# Patient Record
Sex: Male | Born: 1975 | Race: Black or African American | Hispanic: No | Marital: Single | Smoking: Current some day smoker
Health system: Southern US, Community
[De-identification: ages and names within clinical notes are randomized; demographics above are authoritative.]

---

## 2002-04-01 ENCOUNTER — Emergency Department (HOSPITAL_COMMUNITY): Admission: EM | Admit: 2002-04-01 | Discharge: 2002-04-01 | Payer: Self-pay

## 2004-07-26 ENCOUNTER — Emergency Department (HOSPITAL_COMMUNITY): Admission: EM | Admit: 2004-07-26 | Discharge: 2004-07-26 | Payer: Self-pay | Admitting: Emergency Medicine

## 2007-09-27 ENCOUNTER — Emergency Department (HOSPITAL_COMMUNITY): Admission: EM | Admit: 2007-09-27 | Discharge: 2007-09-27 | Payer: Self-pay | Admitting: Emergency Medicine

## 2008-03-20 ENCOUNTER — Emergency Department (HOSPITAL_COMMUNITY): Admission: EM | Admit: 2008-03-20 | Discharge: 2008-03-20 | Payer: Self-pay | Admitting: Emergency Medicine

## 2009-11-21 ENCOUNTER — Emergency Department (HOSPITAL_COMMUNITY): Admission: EM | Admit: 2009-11-21 | Discharge: 2009-11-21 | Payer: Self-pay | Admitting: Family Medicine

## 2009-11-25 ENCOUNTER — Observation Stay (HOSPITAL_COMMUNITY): Admission: EM | Admit: 2009-11-25 | Discharge: 2009-11-26 | Payer: Self-pay | Admitting: Emergency Medicine

## 2009-11-25 DIAGNOSIS — IMO0002 Reserved for concepts with insufficient information to code with codable children: Secondary | ICD-10-CM

## 2009-12-04 ENCOUNTER — Encounter: Admission: RE | Admit: 2009-12-04 | Discharge: 2010-02-28 | Payer: Self-pay | Admitting: Orthopedic Surgery

## 2009-12-17 ENCOUNTER — Ambulatory Visit (HOSPITAL_BASED_OUTPATIENT_CLINIC_OR_DEPARTMENT_OTHER): Admission: RE | Admit: 2009-12-17 | Discharge: 2009-12-17 | Payer: Self-pay | Admitting: Orthopedic Surgery

## 2010-05-21 ENCOUNTER — Encounter (INDEPENDENT_AMBULATORY_CARE_PROVIDER_SITE_OTHER): Payer: Self-pay | Admitting: *Deleted

## 2010-05-28 ENCOUNTER — Ambulatory Visit (HOSPITAL_COMMUNITY): Admission: RE | Admit: 2010-05-28 | Discharge: 2010-05-28 | Payer: Self-pay | Admitting: Infectious Disease

## 2010-05-28 ENCOUNTER — Ambulatory Visit: Payer: Self-pay | Admitting: Infectious Disease

## 2010-05-28 DIAGNOSIS — I10 Essential (primary) hypertension: Secondary | ICD-10-CM | POA: Insufficient documentation

## 2010-05-28 DIAGNOSIS — M86649 Other chronic osteomyelitis, unspecified hand: Secondary | ICD-10-CM

## 2010-05-28 LAB — CONVERTED CEMR LAB
Basophils Absolute: 0 10*3/uL (ref 0.0–0.1)
CO2: 27 meq/L (ref 19–32)
CRP: 0.1 mg/dL (ref <0.6)
Chloride: 105 meq/L (ref 96–112)
Creatinine, Ser: 1.09 mg/dL (ref 0.40–1.50)
Eosinophils Relative: 2 % (ref 0–5)
HCT: 46.2 % (ref 39.0–52.0)
Lymphocytes Relative: 52 % — ABNORMAL HIGH (ref 12–46)
Lymphs Abs: 2.7 10*3/uL (ref 0.7–4.0)
Neutro Abs: 1.9 10*3/uL (ref 1.7–7.7)
Neutrophils Relative %: 38 % — ABNORMAL LOW (ref 43–77)
Platelets: 274 10*3/uL (ref 150–400)
Potassium: 4.6 meq/L (ref 3.5–5.3)
RDW: 12.5 % (ref 11.5–15.5)
Sed Rate: 2 mm/hr (ref 0–16)
Sodium: 141 meq/L (ref 135–145)
WBC: 5.1 10*3/uL (ref 4.0–10.5)

## 2010-06-17 ENCOUNTER — Ambulatory Visit (HOSPITAL_BASED_OUTPATIENT_CLINIC_OR_DEPARTMENT_OTHER): Admission: RE | Admit: 2010-06-17 | Discharge: 2010-06-17 | Payer: Self-pay | Admitting: Orthopedic Surgery

## 2010-06-19 ENCOUNTER — Encounter: Admission: RE | Admit: 2010-06-19 | Discharge: 2010-07-14 | Payer: Self-pay | Admitting: Orthopedic Surgery

## 2010-11-18 NOTE — Miscellaneous (Signed)
Summary: Problems, Medications and Allergies  Clinical Lists Changes  Problems: Added new problem of OPEN FRACTURE MID/PROXIMAL PHALANX/PHALANG HAND (ICD-816.11) - right small finger, first surgery Medications: Added new medication of BACTRIM DS 800-160 MG TABS (SULFAMETHOXAZOLE-TRIMETHOPRIM) Take 1 tablet by mouth two times a day per Dr. Merlyn Lot Added new medication of DOXYCYCLINE HYCLATE 100 MG CAPS (DOXYCYCLINE HYCLATE) Take 1 capsule by mouth two times a day per Dr. Merlyn Lot Added new medication of NORCO 5-325 MG TABS (HYDROCODONE-ACETAMINOPHEN) Take 1-2 tablets by mouth every 6 hours as needed for pain by Dr. Abran Richard - Signed Rx of NORCO 5-325 MG TABS (HYDROCODONE-ACETAMINOPHEN) Take 1-2 tablets by mouth every 6 hours as needed for pain by Dr. Abran Richard;  #60 x 0;  Signed;  Entered by: Jennet Maduro RN;  Authorized by: Paulette Blanch Dam MD;  Method used: Print then Give to Patient Observations: Added new observation of NKA: T (05/21/2010 11:47)    Prescriptions: NORCO 5-325 MG TABS (HYDROCODONE-ACETAMINOPHEN) Take 1-2 tablets by mouth every 6 hours as needed for pain by Dr. Abran Richard  #60 x 0   Entered by:   Jennet Maduro RN   Authorized by:   Acey Lav MD   Signed by:   Jennet Maduro RN on 05/22/2010   Method used:   Print then Give to Patient   RxID:   [id number]   Appended Document: Problems, Medications and Allergies The prescription for [facility] was printed in error.  It was destroyed.

## 2010-11-18 NOTE — Miscellaneous (Signed)
Summary: HIPAA Restrictions  HIPAA Restrictions   Imported By: Florinda Marker 05/29/2010 09:36:59  _____________________________________________________________________  External Attachment:    Type:   Image     Comment:   External Document

## 2010-11-18 NOTE — Assessment & Plan Note (Signed)
Summary: new pt infected R. hand   Visit Type:  Consult Referring Delmont Prosch:  Betha Loa Primary Haleem Hanner:  None  CC:  new patient infected right hand - pinky finger.  History of Present Illness: 35 yo fell on  right hand proximal phalanx fracture after fall from deck. He had I and D with open reduction and percutaneous pinning right  small finger prox phalanz and rerepair of wxtensor tendon on 11/24/09 by Dr. Merlyn Lot and ORIF on 12/17/09. On 3/15 he was given bactrim ds bid for 7 days because pin was pushed back in whe. On 4/13 he was seen and xrays showed some bony erosion on ulnar side of prox fx with ysis around transverse portions of tensionband. he was placed oback on bactrim ds and doxycyline bid. Dr. Merlyn Lot had referred him to our practice at that time. He never was seen (apparently due to his beign out of town frequently. He was given an additional doxy and bactrim on 4/28. He has not been seen by Dr. Merlyn Lot since late April. He finally made appt with [place] now on 05/28/10. He denies fevers or chills but did have drainge of pus from his finger 3 weeks ago. His finger continues to hurt in the am especially. He has been off antibiotics for nearly 3 months.  Problems Prior to Update: 1)  Open Fracture Mid/proximal Phalanx/phalang Hand  (ICD-816.11)  Medications Prior to Update: 1)  Bactrim Ds 800-160 Mg Tabs (Sulfamethoxazole-Trimethoprim) .... Take 1 Tablet By Mouth Two Times A Day Per Dr. Merlyn Lot 2)  Doxycycline Hyclate 100 Mg Caps (Doxycycline Hyclate) .... Take 1 Capsule By Mouth Two Times A Day Per Dr. Merlyn Lot 3)  Norco 5-325 Mg Tabs (Hydrocodone-Acetaminophen) .... Take 1-2 Tablets By Mouth Every 6 Hours As Needed For Pain By Dr. Abran Richard  Current Medications (verified): 1)  Bactrim Ds 800-160 Mg Tabs (Sulfamethoxazole-Trimethoprim) .... Take 1 Tablet By Mouth Two Times A Day Per Dr. Merlyn Lot 2)  Doxycycline Hyclate 100 Mg Caps (Doxycycline Hyclate) .... Take 1 Capsule By Mouth Two Times A Day Per Dr.  Merlyn Lot 3)  Norco 5-325 Mg Tabs (Hydrocodone-Acetaminophen) .... Take 1-2 Tablets By Mouth Every 6 Hours As Needed For Pain By Dr. Abran Richard  Allergies (verified): No Known Drug Allergies   Preventive Screening-Counseling & Management  Alcohol-Tobacco     Alcohol drinks/day: occassionally     Alcohol type: beer     Smoking Status: current     Packs/Day: 0.25  Caffeine-Diet-Exercise     Caffeine use/day: 0     Does Patient Exercise: no     Type of exercise: walk dog  Safety-Violence-Falls     Seat Belt Use: yes   Current Allergies (reviewed today): No known allergies  Past History:  Past Medical History: Fracture as above possible osteomyelitis  Past Surgical History: see above  Family History: Family members healthy to his knowledge  Social History: smoike half pack q 3 day beer every day single  Review of Systems  The patient denies anorexia, fever, weight loss, weight gain, vision loss, decreased hearing, hoarseness, chest pain, syncope, dyspnea on exertion, peripheral edema, prolonged cough, headaches, hemoptysis, abdominal pain, melena, hematochezia, severe indigestion/heartburn, hematuria, incontinence, genital sores, muscle weakness, suspicious skin lesions, transient blindness, difficulty walking, depression, unusual weight change, abnormal bleeding, and enlarged lymph nodes.    Vital Signs:  Patient profile:   35 year old male Height:      69 inches (175.26 cm) Weight:      145.8 pounds (66.27 kg)  BMI:     21.61 Temp:     98.2 degrees F oral Pulse rate:   72 / minute BP sitting:   136 / 78  (left arm)  Vitals Entered By: Kathi Simpers [facility]) (May 28, 2010 10:50 AM) CC: new patient infected right hand - pinky finger Pain Assessment Patient in pain? yes     Location: right pinky finger Intensity: 4 Type: tingling Onset of pain  Constant Nutritional Status BMI of 19 -24 = normal Nutritional Status Detail appetite is fine per patient  Does  patient need assistance? Functional Status Self care Ambulation Normal   Physical Exam  General:  alert, well-developed, well-nourished, and well-hydrated.   Head:  normocephalic, atraumatic, no abnormalities observed, and no abnormalities palpated.   Eyes:  vision grossly intact, pupils equal, pupils round, and pupils reactive to light.   Ears:  no external deformities.   Nose:  no external deformity and no nasal discharge.   Mouth:  pharynx pink and moist, no erythema, and no exudates.   Neck:  supple and full ROM.   Lungs:  normal respiratory effort, no crackles, and no wheezes.   Heart:  normal rate, regular rhythm, no murmur, and no gallop.   Abdomen:  soft, normal bowel sounds, and no distention.   Msk:  his pIP joint of right hand 5th finger is swollen but without drainge. He is tender to palation in this area. Extremities:  No clubbing, cyanosis, edema, or deformity noted with normal full range of motion of all joints.   Neurologic:  alert & oriented X3, sensation intact to light touch, and sensation intact to pinprick.   Skin:  no rashes Psych:  Oriented X3, normally interactive, and good eye contact.     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 I AM suspicous of osteo. I am inclined to push for biopsy of bone OFF antibioitcs with cutlures sent for bacteria, AFB and fungi followed by systemic antibiotics (preferably IV, Will discuss with Dr. Merlyn Lot. ) WIll check HIV elisa for health screening purposes His updated medication list for this problem includes:    Bactrim Ds 800-160 Mg Tabs (Sulfamethoxazole-trimethoprim) .Marland Kitchen... Take 1 tablet by mouth two times a day per dr. Merlyn Lot    Doxycycline Hyclate 100 Mg Caps (Doxycycline hyclate) .Marland Kitchen... Take 1 capsule by mouth two times a day per dr. Dixie Dials 5-325 Mg Tabs (Hydrocodone-acetaminophen) .Marland Kitchen... Take 1-2 tablets by mouth every 6 hours as needed  for pain by dr. Abran Richard  Orders: T-Basic Metabolic Panel [phone]) T-C-Reactive Protein [phone]) T-CBC w/Diff [phone]) T-Sed Rate (Automated) [phone]) T-HIV Antibody  (Reflex) ([id number]) Diagnostic X-Ray/Fluoroscopy (Diagnostic X-Ray/Flu) Est. Patient Level IV (22025)  Problem # 2:  OPEN FRACTURE MID/PROXIMAL PHALANX/PHALANG HAND (ICD-816.11)  concern is for osteo associated with hardware.  Orders: Est. Patient Level IV (42706)  Problem # 3:  ESSENTIAL HYPERTENSION (ICD-401.9)  He has bit of white coat htn likely  Orders: Est. Patient Level IV (23762)  Patient Instructions: 1)  rtc to see Dr. Daiva Eves in one month  Appended Document: PT IS LEVEL 4 CONSULT Pam Please NOTE THAT THIS PATIENT WAS A NEW CONSULT LEVEL FOUR, NOT AN ESTABLISHED PT (THAT WAS BILING ERRO)   Clinical Lists Changes  Orders: Added new Service order of Consultation Level IV [phone]) - Signed

## 2011-01-02 LAB — TISSUE CULTURE: Gram Stain: NONE SEEN

## 2011-01-02 LAB — ANAEROBIC CULTURE: Gram Stain: NONE SEEN

## 2011-01-02 LAB — POCT HEMOGLOBIN-HEMACUE: Hemoglobin: 18.3 g/dL — ABNORMAL HIGH (ref 13.0–17.0)

## 2011-01-07 ENCOUNTER — Encounter: Payer: Self-pay | Admitting: *Deleted

## 2011-01-11 LAB — POCT HEMOGLOBIN-HEMACUE: Hemoglobin: 16.4 g/dL (ref 13.0–17.0)

## 2011-03-31 ENCOUNTER — Ambulatory Visit: Payer: Worker's Compensation | Attending: Physical Medicine and Rehabilitation | Admitting: Physical Therapy

## 2011-03-31 ENCOUNTER — Ambulatory Visit: Payer: Worker's Compensation | Attending: Physical Medicine and Rehabilitation | Admitting: Occupational Therapy

## 2011-03-31 ENCOUNTER — Ambulatory Visit: Payer: Worker's Compensation

## 2011-03-31 DIAGNOSIS — Z5189 Encounter for other specified aftercare: Secondary | ICD-10-CM | POA: Insufficient documentation

## 2011-03-31 DIAGNOSIS — M25579 Pain in unspecified ankle and joints of unspecified foot: Secondary | ICD-10-CM | POA: Insufficient documentation

## 2011-03-31 DIAGNOSIS — R41841 Cognitive communication deficit: Secondary | ICD-10-CM | POA: Insufficient documentation

## 2011-03-31 DIAGNOSIS — R4701 Aphasia: Secondary | ICD-10-CM | POA: Insufficient documentation

## 2011-03-31 DIAGNOSIS — M6281 Muscle weakness (generalized): Secondary | ICD-10-CM | POA: Insufficient documentation

## 2011-03-31 DIAGNOSIS — M2550 Pain in unspecified joint: Secondary | ICD-10-CM | POA: Insufficient documentation

## 2011-03-31 DIAGNOSIS — M2560 Stiffness of unspecified joint, not elsewhere classified: Secondary | ICD-10-CM | POA: Insufficient documentation

## 2011-04-06 ENCOUNTER — Ambulatory Visit: Payer: Worker's Compensation | Admitting: *Deleted

## 2011-04-08 ENCOUNTER — Ambulatory Visit: Payer: Worker's Compensation | Admitting: Occupational Therapy

## 2011-04-08 ENCOUNTER — Ambulatory Visit: Payer: Worker's Compensation

## 2011-04-09 ENCOUNTER — Ambulatory Visit: Payer: Worker's Compensation

## 2011-04-09 ENCOUNTER — Ambulatory Visit: Payer: Worker's Compensation | Admitting: Physical Therapy

## 2011-04-13 ENCOUNTER — Ambulatory Visit: Payer: Worker's Compensation | Admitting: Physical Therapy

## 2011-04-13 ENCOUNTER — Ambulatory Visit: Payer: Worker's Compensation

## 2011-04-15 ENCOUNTER — Ambulatory Visit: Payer: Worker's Compensation | Admitting: Occupational Therapy

## 2011-04-16 ENCOUNTER — Ambulatory Visit: Payer: Worker's Compensation | Admitting: Physical Therapy

## 2011-04-16 ENCOUNTER — Ambulatory Visit: Payer: Worker's Compensation | Admitting: Occupational Therapy

## 2011-04-16 ENCOUNTER — Ambulatory Visit: Payer: Worker's Compensation

## 2011-04-20 ENCOUNTER — Ambulatory Visit: Payer: Worker's Compensation | Admitting: Occupational Therapy

## 2011-04-20 ENCOUNTER — Ambulatory Visit: Payer: Worker's Compensation | Attending: Physical Medicine and Rehabilitation | Admitting: Physical Therapy

## 2011-04-20 ENCOUNTER — Ambulatory Visit: Payer: Worker's Compensation

## 2011-04-20 DIAGNOSIS — R4701 Aphasia: Secondary | ICD-10-CM | POA: Insufficient documentation

## 2011-04-20 DIAGNOSIS — Z5189 Encounter for other specified aftercare: Secondary | ICD-10-CM | POA: Insufficient documentation

## 2011-04-20 DIAGNOSIS — M2550 Pain in unspecified joint: Secondary | ICD-10-CM | POA: Insufficient documentation

## 2011-04-20 DIAGNOSIS — M2560 Stiffness of unspecified joint, not elsewhere classified: Secondary | ICD-10-CM | POA: Insufficient documentation

## 2011-04-20 DIAGNOSIS — M6281 Muscle weakness (generalized): Secondary | ICD-10-CM | POA: Insufficient documentation

## 2011-04-20 DIAGNOSIS — R41841 Cognitive communication deficit: Secondary | ICD-10-CM | POA: Insufficient documentation

## 2011-04-23 ENCOUNTER — Ambulatory Visit: Payer: Worker's Compensation | Admitting: Physical Therapy

## 2011-04-23 ENCOUNTER — Ambulatory Visit: Payer: Worker's Compensation

## 2011-04-27 ENCOUNTER — Encounter: Payer: Self-pay | Admitting: *Deleted

## 2011-04-27 ENCOUNTER — Encounter: Payer: Self-pay | Admitting: Occupational Therapy

## 2011-04-27 ENCOUNTER — Ambulatory Visit: Payer: Self-pay | Admitting: Physical Therapy

## 2011-04-30 ENCOUNTER — Ambulatory Visit: Payer: Worker's Compensation

## 2011-04-30 ENCOUNTER — Ambulatory Visit: Payer: Worker's Compensation | Admitting: Occupational Therapy

## 2011-04-30 ENCOUNTER — Ambulatory Visit: Payer: Worker's Compensation | Admitting: Physical Therapy

## 2011-05-05 ENCOUNTER — Ambulatory Visit: Payer: Worker's Compensation | Admitting: Physical Therapy

## 2011-05-05 ENCOUNTER — Ambulatory Visit: Payer: Worker's Compensation

## 2011-05-05 ENCOUNTER — Ambulatory Visit: Payer: Worker's Compensation | Admitting: Occupational Therapy

## 2011-05-06 ENCOUNTER — Ambulatory Visit: Payer: Worker's Compensation | Admitting: Physical Therapy

## 2011-05-06 ENCOUNTER — Ambulatory Visit: Payer: Worker's Compensation | Admitting: Occupational Therapy

## 2011-05-06 ENCOUNTER — Ambulatory Visit: Payer: Worker's Compensation

## 2011-05-12 ENCOUNTER — Ambulatory Visit: Payer: Worker's Compensation | Admitting: Physical Therapy

## 2011-05-12 ENCOUNTER — Ambulatory Visit: Payer: Worker's Compensation | Admitting: Occupational Therapy

## 2011-05-12 ENCOUNTER — Ambulatory Visit: Payer: Worker's Compensation | Admitting: *Deleted

## 2011-05-14 ENCOUNTER — Ambulatory Visit: Payer: Worker's Compensation | Admitting: Occupational Therapy

## 2011-05-14 ENCOUNTER — Ambulatory Visit: Payer: Worker's Compensation | Admitting: *Deleted

## 2011-05-14 ENCOUNTER — Ambulatory Visit: Payer: Worker's Compensation | Admitting: Physical Therapy

## 2011-05-20 ENCOUNTER — Ambulatory Visit
Payer: Worker's Compensation | Attending: Physical Medicine and Rehabilitation | Admitting: Rehabilitative and Restorative Service Providers"

## 2011-05-20 ENCOUNTER — Ambulatory Visit: Payer: Worker's Compensation | Admitting: Occupational Therapy

## 2011-05-20 DIAGNOSIS — R41841 Cognitive communication deficit: Secondary | ICD-10-CM | POA: Insufficient documentation

## 2011-05-20 DIAGNOSIS — M2560 Stiffness of unspecified joint, not elsewhere classified: Secondary | ICD-10-CM | POA: Insufficient documentation

## 2011-05-20 DIAGNOSIS — Z5189 Encounter for other specified aftercare: Secondary | ICD-10-CM | POA: Insufficient documentation

## 2011-05-20 DIAGNOSIS — R4701 Aphasia: Secondary | ICD-10-CM | POA: Insufficient documentation

## 2011-05-20 DIAGNOSIS — M2550 Pain in unspecified joint: Secondary | ICD-10-CM | POA: Insufficient documentation

## 2011-05-20 DIAGNOSIS — M6281 Muscle weakness (generalized): Secondary | ICD-10-CM | POA: Insufficient documentation

## 2011-05-21 ENCOUNTER — Ambulatory Visit: Payer: Worker's Compensation

## 2011-05-21 ENCOUNTER — Ambulatory Visit: Payer: Worker's Compensation | Admitting: Physical Therapy

## 2011-05-27 ENCOUNTER — Encounter: Payer: Self-pay | Admitting: *Deleted

## 2011-05-27 ENCOUNTER — Encounter: Payer: Self-pay | Admitting: Occupational Therapy

## 2011-05-27 ENCOUNTER — Ambulatory Visit: Payer: Worker's Compensation | Admitting: Physical Therapy

## 2011-05-27 ENCOUNTER — Ambulatory Visit: Payer: Self-pay | Admitting: Physical Therapy

## 2011-05-27 ENCOUNTER — Ambulatory Visit: Payer: Worker's Compensation

## 2011-05-27 ENCOUNTER — Ambulatory Visit: Payer: Worker's Compensation | Admitting: Occupational Therapy

## 2011-05-28 ENCOUNTER — Ambulatory Visit: Payer: Worker's Compensation | Admitting: Occupational Therapy

## 2011-05-28 ENCOUNTER — Ambulatory Visit: Payer: Worker's Compensation | Admitting: Physical Therapy

## 2011-06-01 ENCOUNTER — Ambulatory Visit: Payer: Worker's Compensation

## 2011-06-02 ENCOUNTER — Ambulatory Visit: Payer: Worker's Compensation | Admitting: Occupational Therapy

## 2011-06-02 ENCOUNTER — Ambulatory Visit: Payer: Worker's Compensation | Admitting: Physical Therapy

## 2011-06-04 ENCOUNTER — Ambulatory Visit: Payer: Worker's Compensation | Admitting: Occupational Therapy

## 2011-06-04 ENCOUNTER — Ambulatory Visit: Payer: Worker's Compensation | Admitting: Physical Therapy

## 2011-06-09 ENCOUNTER — Ambulatory Visit: Payer: Self-pay | Admitting: Physical Therapy

## 2011-06-09 ENCOUNTER — Ambulatory Visit: Payer: Worker's Compensation | Admitting: Occupational Therapy

## 2011-06-09 ENCOUNTER — Ambulatory Visit: Payer: Worker's Compensation | Admitting: Physical Therapy

## 2011-06-12 ENCOUNTER — Ambulatory Visit: Payer: Worker's Compensation | Admitting: Occupational Therapy

## 2011-06-16 ENCOUNTER — Encounter: Payer: Self-pay | Admitting: Occupational Therapy

## 2011-06-19 ENCOUNTER — Encounter: Payer: Self-pay | Admitting: Occupational Therapy

## 2011-07-16 LAB — DIFFERENTIAL
Basophils Absolute: 0
Basophils Relative: 0
Lymphocytes Relative: 31
Monocytes Relative: 6
Neutro Abs: 3.1
Neutrophils Relative %: 62

## 2011-07-16 LAB — URINALYSIS, ROUTINE W REFLEX MICROSCOPIC
Nitrite: NEGATIVE
Specific Gravity, Urine: 1.014
Urobilinogen, UA: 1
pH: 7

## 2011-07-16 LAB — POCT I-STAT, CHEM 8
Chloride: 105
Creatinine, Ser: 1.1
HCT: 47
Hemoglobin: 16
Potassium: 4
Sodium: 139

## 2011-07-16 LAB — CBC
Hemoglobin: 15.7
MCHC: 35.4
RBC: 4.67

## 2011-12-17 ENCOUNTER — Emergency Department (HOSPITAL_COMMUNITY)
Admission: EM | Admit: 2011-12-17 | Discharge: 2011-12-17 | Disposition: A | Discharge status: Home / Self Care | Payer: Self-pay | Source: Home / Self Care | Attending: Family Medicine | Admitting: Family Medicine

## 2011-12-17 ENCOUNTER — Encounter (HOSPITAL_COMMUNITY): Payer: Self-pay | Admitting: *Deleted

## 2011-12-17 DIAGNOSIS — Z113 Encounter for screening for infections with a predominantly sexual mode of transmission: Secondary | ICD-10-CM

## 2011-12-17 MED ORDER — AZITHROMYCIN 250 MG PO TABS
ORAL_TABLET | ORAL | Status: AC
  Filled 2011-12-17: qty 4

## 2011-12-17 MED ORDER — AZITHROMYCIN 250 MG PO TABS
1000.0000 mg | ORAL_TABLET | Freq: Once | ORAL | Status: AC
  Administered 2011-12-17: 1000 mg via ORAL

## 2011-12-17 NOTE — Discharge Instructions (Signed)
We will call with test results and treat as indicated °

## 2011-12-17 NOTE — ED Provider Notes (Signed)
History     CSN: [id number]  Arrival date & time 12/17/11  1722   First MD Initiated Contact with Patient 12/17/11 1733      Chief Complaint  Patient presents with  . Exposure to STD    (Consider location/radiation/quality/duration/timing/severity/associated sxs/prior treatment) Patient is a 36 y.o. male presenting with STD exposure. The history is provided by the patient.  Exposure to STD This is a new problem. The current episode started 2 days ago (possible exposure to chlamydia, no sx, uses condoms, no std hx, according to pt.).    History reviewed. No pertinent past medical history.  History reviewed. No pertinent past surgical history.  History reviewed. No pertinent family history.  History  Substance Use Topics  . Smoking status: Current Everyday Smoker  . Smokeless tobacco: Not on file  . Alcohol Use: Yes      Review of Systems  Constitutional: Negative.   Genitourinary: Negative.  Negative for discharge and penile pain.    Allergies  Review of patient's allergies indicates not on file.  Home Medications   Current Outpatient Rx  Name Route Sig Dispense Refill  . DOXYCYCLINE HYCLATE 100 MG PO CAPS Oral Take 100 mg by mouth 2 (two) times daily. Per Dr. Merlyn Lot     . HYDROCODONE-ACETAMINOPHEN 5-325 MG PO TABS Oral Take by mouth as directed. Take 1-2 tablets by mouth every 6 hours as needed for pain by Dr. Abran Richard     . SULFAMETHOXAZOLE-TRIMETHOPRIM 800-160 MG PO TABS Oral Take 1 tablet by mouth 2 (two) times daily. Per Dr. Merlyn Lot       BP 163/92  Pulse 90  Temp(Src) 99 F (37.2 C) (Oral)  Resp 16  SpO2 99%  Physical Exam  Nursing note and vitals reviewed. Constitutional: He appears well-developed and well-nourished.  Abdominal: Soft. Bowel sounds are normal. There is no tenderness.  Genitourinary: Testes normal and penis normal. Cremasteric reflex is present. Right testis shows no tenderness. Left testis shows no tenderness. Circumcised. No penile  tenderness. No discharge found.  Lymphadenopathy:       Right: No inguinal adenopathy present.       Left: No inguinal adenopathy present.    ED Course  Procedures (including critical care time)   Labs Reviewed  GC/CHLAMYDIA PROBE AMP, GENITAL   No results found.   1. Screening for STD (sexually transmitted disease)       MDM          Barkley Bruns, MD 12/17/11 [phone]

## 2011-12-17 NOTE — ED Notes (Signed)
Pt    Wants  To be  Checked  For  Std  He  States  His  Partner  Went to  Dr  Callie Fielding days  Ago  And  He  Thinks   She  Has  chlymydia  He  denys  Any  Symptoms

## 2011-12-18 LAB — GC/CHLAMYDIA PROBE AMP, GENITAL
Chlamydia, DNA Probe: NEGATIVE
GC Probe Amp, Genital: NEGATIVE

## 2013-02-13 ENCOUNTER — Emergency Department (HOSPITAL_COMMUNITY)
Admission: EM | Admit: 2013-02-13 | Discharge: 2013-02-14 | Disposition: A | Discharge status: Home / Self Care | Payer: Self-pay | Attending: Emergency Medicine | Admitting: Emergency Medicine

## 2013-02-13 ENCOUNTER — Encounter (HOSPITAL_COMMUNITY): Payer: Self-pay | Admitting: Emergency Medicine

## 2013-02-13 ENCOUNTER — Emergency Department (HOSPITAL_COMMUNITY): Payer: Self-pay

## 2013-02-13 DIAGNOSIS — F17200 Nicotine dependence, unspecified, uncomplicated: Secondary | ICD-10-CM | POA: Insufficient documentation

## 2013-02-13 DIAGNOSIS — S29011A Strain of muscle and tendon of front wall of thorax, initial encounter: Secondary | ICD-10-CM

## 2013-02-13 DIAGNOSIS — Y9389 Activity, other specified: Secondary | ICD-10-CM | POA: Insufficient documentation

## 2013-02-13 DIAGNOSIS — Y929 Unspecified place or not applicable: Secondary | ICD-10-CM | POA: Insufficient documentation

## 2013-02-13 DIAGNOSIS — S23429A Unspecified sprain of sternum, initial encounter: Secondary | ICD-10-CM | POA: Insufficient documentation

## 2013-02-13 DIAGNOSIS — X500XXA Overexertion from strenuous movement or load, initial encounter: Secondary | ICD-10-CM | POA: Insufficient documentation

## 2013-02-13 LAB — BASIC METABOLIC PANEL
BUN: 15 mg/dL (ref 6–23)
GFR calc Af Amer: 77 mL/min — ABNORMAL LOW (ref >90)
GFR calc non Af Amer: 66 mL/min — ABNORMAL LOW (ref >90)
Potassium: 4.1 mEq/L (ref 3.5–5.1)
Sodium: 141 mEq/L (ref 135–145)

## 2013-02-13 LAB — POCT I-STAT TROPONIN I: Troponin i, poc: 0.01 ng/mL (ref 0.00–0.08)

## 2013-02-13 LAB — CBC
MCHC: 37 g/dL — ABNORMAL HIGH (ref 30.0–36.0)
Platelets: 256 10*3/uL (ref 150–400)
RDW: 12.1 % (ref 11.5–15.5)

## 2013-02-13 NOTE — ED Notes (Signed)
NURSE FIRST ROUNDS : NURSE EXPLAINED DELAY AND PROCESS , RESPIRATIONS UNLABORED , DENIES PAIN SITTING AT WAITING AREA WITH NO DISTRESS.

## 2013-02-13 NOTE — ED Notes (Signed)
Pt c/o left sided CP worse with movement and palpation x 3 days

## 2013-02-14 MED ORDER — IBUPROFEN 800 MG PO TABS: 800.0000 mg | ORAL_TABLET | Freq: Three times a day (TID) | ORAL | Status: DC | PRN

## 2013-02-14 MED ORDER — IBUPROFEN 800 MG PO TABS
800.0000 mg | ORAL_TABLET | Freq: Once | ORAL | Status: AC
  Administered 2013-02-14: 800 mg via ORAL
  Filled 2013-02-14: qty 1

## 2013-02-14 MED ORDER — TRAMADOL HCL 50 MG PO TABS: 50.0000 mg | ORAL_TABLET | Freq: Four times a day (QID) | ORAL | Status: DC

## 2013-02-14 MED ORDER — TRAMADOL HCL 50 MG PO TABS
50.0000 mg | ORAL_TABLET | Freq: Four times a day (QID) | ORAL | Status: DC
  Administered 2013-02-14: 50 mg via ORAL
  Filled 2013-02-14: qty 1

## 2013-02-14 NOTE — ED Notes (Signed)
Pt discharged.Vital signs stable and GCS 15 

## 2013-02-14 NOTE — ED Provider Notes (Signed)
History     CSN: [id number]  Arrival date & time 02/13/13  1478   First MD Initiated Contact with Patient 02/13/13 2345      Chief Complaint  Patient presents with  . Chest Pain    (Consider location/radiation/quality/duration/timing/severity/associated sxs/prior treatment) HPI 36 rolled male presents to emergency room with complaint of left chest wall pain.  Patient reports pain started the day after he lifted his lawnmower into his car.  Patient with point specific pain, just below his left nipple.  Patient works at a [employer] place.  He reports since the injury, any sort of lifting makes the pain worse.  He denies any shortness of breath.  No radiation of the pain.  No nausea no diaphoresis.  Patient is otherwise healthy.  He reports he smokes less than a pack a day.  No leg swelling.  No fevers no chills.  No cough  History reviewed. No pertinent past medical history.  History reviewed. No pertinent past surgical history.  History reviewed. No pertinent family history.  History  Substance Use Topics  . Smoking status: Current Every Day Smoker  . Smokeless tobacco: Not on file  . Alcohol Use: Yes      Review of Systems  See History of Present Illness; otherwise all other systems are reviewed and negative Allergies  Review of patient's allergies indicates no known allergies.  Home Medications  No current outpatient prescriptions on file.  BP 128/86  Pulse 67  Temp(Src) 98.2 F (36.8 C) (Oral)  Resp 14  SpO2 100%  Physical Exam  Nursing note and vitals reviewed. Constitutional: He is oriented to person, place, and time. He appears well-developed and well-nourished.  HENT:  Head: Normocephalic and atraumatic.  Right Ear: External ear normal.  Left Ear: External ear normal.  Nose: Nose normal.  Mouth/Throat: Oropharynx is clear and moist.  Eyes: Conjunctivae and EOM are normal. Pupils are equal, round, and reactive to light.  Neck: Normal range of motion. Neck  supple. No JVD present. No tracheal deviation present. No thyromegaly present.  Cardiovascular: Normal rate, regular rhythm, normal heart sounds and intact distal pulses.  Exam reveals no gallop and no friction rub.   No murmur heard. Pulmonary/Chest: Effort normal and breath sounds normal. No stridor. No respiratory distress. He has no wheezes. He has no rales. He exhibits tenderness (patient with specific tenderness to the left chest wall just under the nipple.  There are no masses.  No skin changes.).  Abdominal: Soft. Bowel sounds are normal. He exhibits no distension and no mass. There is no tenderness. There is no rebound and no guarding.  Musculoskeletal: Normal range of motion. He exhibits no edema and no tenderness.  Lymphadenopathy:    He has no cervical adenopathy.  Neurological: He is oriented to person, place, and time. He has normal reflexes. No cranial nerve deficit. He exhibits normal muscle tone. Coordination normal.  Skin: Skin is dry. No rash noted. No erythema. No pallor.  Psychiatric: He has a normal mood and affect. His behavior is normal. Judgment and thought content normal.    ED Course  Procedures (including critical care time)  Labs Reviewed  CBC - Abnormal; Notable for the following:    MCHC 37.0 (*)    All other components within normal limits  BASIC METABOLIC PANEL - Abnormal; Notable for the following:    GFR calc non Af Amer 66 (*)    GFR calc Af Amer 77 (*)    All other components within  normal limits  POCT I-STAT TROPONIN I   Dg Chest 2 View  02/13/2013  *RADIOLOGY REPORT*  Clinical Data: Left-sided chest pain for 2 days.  CHEST - 2 VIEW  Comparison: 03/20/2008 radiograph.  Findings:  Cardiopericardial silhouette within normal limits. Mediastinal contours normal. Trachea midline.  No airspace disease or effusion.  IMPRESSION: No active cardiopulmonary disease.   Original Report Authenticated By: Andreas Newport, M.D.     Date: 02/14/2013  Rate: 79   Rhythm: normal sinus rhythm  QRS Axis: right  Intervals: normal  ST/T Wave abnormalities: normal  Conduction Disutrbances:none  Narrative Interpretation:   Old EKG Reviewed: none available    1. Chest wall muscle strain, initial encounter       MDM  37 year old male with left chest wall pain.  EKG normal.  Chest x-ray, normal.  Labwork unremarkable.  Pain is reducible with palpation.  Will treat symptomatically        Olivia Mackie, MD 02/14/13 [phone]

## 2013-04-04 ENCOUNTER — Encounter (HOSPITAL_COMMUNITY): Payer: Self-pay | Admitting: Emergency Medicine

## 2013-04-04 ENCOUNTER — Emergency Department (INDEPENDENT_AMBULATORY_CARE_PROVIDER_SITE_OTHER)
Admission: EM | Admit: 2013-04-04 | Discharge: 2013-04-04 | Disposition: A | Discharge status: Home / Self Care | Payer: Self-pay | Source: Home / Self Care | Attending: Family Medicine | Admitting: Family Medicine

## 2013-04-04 DIAGNOSIS — M65839 Other synovitis and tenosynovitis, unspecified forearm: Secondary | ICD-10-CM

## 2013-04-04 DIAGNOSIS — M778 Other enthesopathies, not elsewhere classified: Secondary | ICD-10-CM

## 2013-04-04 MED ORDER — DICLOFENAC POTASSIUM 50 MG PO TABS: 50.0000 mg | ORAL_TABLET | Freq: Three times a day (TID) | ORAL | Status: DC

## 2013-04-04 NOTE — ED Provider Notes (Addendum)
History     CSN: [id number]  Arrival date & time 04/04/13  1046   None     Chief Complaint  Patient presents with  . Wrist Pain    (Consider location/radiation/quality/duration/timing/severity/associated sxs/prior treatment) Patient is a 37 y.o. male presenting with wrist pain. The history is provided by the patient.  Wrist Pain This is a new problem. The current episode started more than 1 week ago. The problem occurs constantly. The problem has been gradually worsening. Exacerbated by: works at [employer], sore with work activity.    History reviewed. No pertinent past medical history.  History reviewed. No pertinent past surgical history.  No family history on file.  History  Substance Use Topics  . Smoking status: Current Every Day Smoker    Types: Cigarettes  . Smokeless tobacco: Not on file  . Alcohol Use: Yes     Comment: occasionally      Review of Systems  Constitutional: Negative.   Musculoskeletal: Negative for joint swelling.  Skin: Negative.     Allergies  Review of patient's allergies indicates no known allergies.  Home Medications   Current Outpatient Rx  Name  Route  Sig  Dispense  Refill  . diclofenac (CATAFLAM) 50 MG tablet   Oral   Take 1 tablet (50 mg total) by mouth 3 (three) times daily. For wrist pain   30 tablet   0   . ibuprofen (ADVIL,MOTRIN) 800 MG tablet   Oral   Take 1 tablet (800 mg total) by mouth every 8 (eight) hours as needed for pain.   30 tablet   0   . traMADol (ULTRAM) 50 MG tablet   Oral   Take 1 tablet (50 mg total) by mouth every 6 (six) hours.   30 tablet   0     BP 130/89  Pulse 78  Temp(Src) 97.1 F (36.2 C) (Oral)  Resp 16  SpO2 96%  Physical Exam  Nursing note and vitals reviewed. Constitutional: He is oriented to person, place, and time. He appears well-developed and well-nourished.  Musculoskeletal: He exhibits tenderness.  Tender ulnar coll lig area of wrist, increased with  ulnar flexion,, no sts or deformity.  Neurological: He is alert and oriented to person, place, and time.  Skin: Skin is warm and dry.    ED Course  Procedures (including critical care time)  Labs Reviewed - No data to display No results found.   1. Tendonitis of wrist, right       MDM          Linna Hoff, MD 04/04/13 1148  Linna Hoff, MD 04/04/13 [phone]

## 2013-04-04 NOTE — ED Notes (Signed)
Pt c/o right wrist pain x 1 week. No known injury. Pain is only in wrist. Uses hands/wrist a lot in his line of work. Has tried using brace with no real relief. Patient is alert and oriented.

## 2014-03-23 ENCOUNTER — Emergency Department (HOSPITAL_COMMUNITY)
Admission: EM | Admit: 2014-03-23 | Discharge: 2014-03-23 | Disposition: A | Discharge status: Home / Self Care | Payer: Worker's Compensation | Attending: Emergency Medicine | Admitting: Emergency Medicine

## 2014-03-23 ENCOUNTER — Encounter (HOSPITAL_COMMUNITY): Payer: Self-pay | Admitting: Emergency Medicine

## 2014-03-23 ENCOUNTER — Emergency Department (HOSPITAL_COMMUNITY): Payer: Worker's Compensation

## 2014-03-23 DIAGNOSIS — I509 Heart failure, unspecified: Secondary | ICD-10-CM | POA: Insufficient documentation

## 2014-03-23 DIAGNOSIS — F17200 Nicotine dependence, unspecified, uncomplicated: Secondary | ICD-10-CM | POA: Insufficient documentation

## 2014-03-23 DIAGNOSIS — R002 Palpitations: Secondary | ICD-10-CM | POA: Insufficient documentation

## 2014-03-23 LAB — CBC WITH DIFFERENTIAL/PLATELET
BASOS PCT: 0 % (ref 0–1)
Basophils Absolute: 0 10*3/uL (ref 0.0–0.1)
EOS ABS: 0.1 10*3/uL (ref 0.0–0.7)
EOS PCT: 3 % (ref 0–5)
HCT: 44 % (ref 39.0–52.0)
HEMOGLOBIN: 15.3 g/dL (ref 13.0–17.0)
LYMPHS ABS: 2.2 10*3/uL (ref 0.7–4.0)
Lymphocytes Relative: 47 % — ABNORMAL HIGH (ref 12–46)
MCH: 32.2 pg (ref 26.0–34.0)
MCHC: 34.8 g/dL (ref 30.0–36.0)
MCV: 92.6 fL (ref 78.0–100.0)
MONOS PCT: 8 % (ref 3–12)
Monocytes Absolute: 0.4 10*3/uL (ref 0.1–1.0)
NEUTROS PCT: 42 % — AB (ref 43–77)
Neutro Abs: 2 10*3/uL (ref 1.7–7.7)
Platelets: 232 10*3/uL (ref 150–400)
RBC: 4.75 MIL/uL (ref 4.22–5.81)
RDW: 12.3 % (ref 11.5–15.5)
WBC: 4.8 10*3/uL (ref 4.0–10.5)

## 2014-03-23 LAB — BASIC METABOLIC PANEL
BUN: 9 mg/dL (ref 6–23)
CALCIUM: 9.4 mg/dL (ref 8.4–10.5)
CO2: 25 mEq/L (ref 19–32)
CREATININE: 0.95 mg/dL (ref 0.50–1.35)
Chloride: 102 mEq/L (ref 96–112)
GLUCOSE: 93 mg/dL (ref 70–99)
POTASSIUM: 4.1 meq/L (ref 3.7–5.3)
Sodium: 143 mEq/L (ref 137–147)

## 2014-03-23 LAB — I-STAT TROPONIN, ED
TROPONIN I, POC: 0 ng/mL (ref 0.00–0.08)
Troponin i, poc: 0 ng/mL (ref 0.00–0.08)

## 2014-03-23 NOTE — ED Notes (Signed)
Patient returned from X-ray 

## 2014-03-23 NOTE — ED Provider Notes (Signed)
CSN: [id number]     Arrival date & time 03/23/14  1437 History   First MD Initiated Contact with Patient 03/23/14 1526     Chief Complaint  Patient presents with  . Palpitations     (Consider location/radiation/quality/duration/timing/severity/associated sxs/prior Treatment) HPI Comments: Patient is an otherwise healthy 38 year old male who presents today with the sensation that his heart is beating very hard. 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 He denies any shortness of breath, diaphoresis, nausea, vomiting. He has never had this in the past. He has never seen a cardiologist for any reason. He denies any family history of early heart disease. He continues to feel like his heart is beating out of his chest.  Patient is a 38 y.o. male presenting with palpitations. The history is provided by the patient. No language interpreter was used.  Palpitations Associated symptoms: no chest pain, no diaphoresis, no nausea, no shortness of breath and no vomiting     History reviewed. No pertinent past medical history. History reviewed. No pertinent past surgical history. No family history on file. History  Substance Use Topics  . Smoking status: Current Every Day Smoker    Types: Cigarettes  . Smokeless tobacco: Not on file  . Alcohol Use: Yes     Comment: occasionally    Review of Systems  Constitutional: Negative for fever, chills and diaphoresis.  Respiratory: Negative for chest tightness and shortness of breath.   Cardiovascular: Positive for palpitations. Negative for chest pain.  Gastrointestinal: Negative for nausea, vomiting and abdominal pain.  All other systems reviewed and are negative.     Allergies  Review of patient's allergies indicates no known allergies.  Home Medications   Prior to Admission medications   Not on File   BP 148/80  Pulse 64  Temp(Src) 98.8 F (37.1 C) (Oral)  Resp 18  SpO2  99% Physical Exam  Nursing note and vitals reviewed. Constitutional: He is oriented to person, place, and time. He appears well-developed and well-nourished. No distress.  Very well appearing. Patient is laying comfortably in the bed.  HENT:  Head: Normocephalic and atraumatic.  Right Ear: External ear normal.  Left Ear: External ear normal.  Nose: Nose normal.  Eyes: Conjunctivae are normal.  Neck: Normal range of motion. No tracheal deviation present.  Cardiovascular: Normal rate, regular rhythm, normal heart sounds, intact distal pulses and normal pulses.   Pulses:      Radial pulses are 2+ on the right side, and 2+ on the left side.       Posterior tibial pulses are 2+ on the right side, and 2+ on the left side.  Pulmonary/Chest: Effort normal and breath sounds normal. No stridor.  Abdominal: Soft. He exhibits no distension. There is no tenderness.  Musculoskeletal: Normal range of motion.  Neurological: He is alert and oriented to person, place, and time.  Skin: Skin is warm and dry. He is not diaphoretic.  Psychiatric: He has a normal mood and affect. His behavior is normal.    ED Course  Procedures (including critical care time) Labs Review Labs Reviewed  CBC WITH DIFFERENTIAL - Abnormal; Notable for the following:    Neutrophils Relative % 42 (*)    Lymphocytes Relative 47 (*)    All other components within normal limits  BASIC METABOLIC PANEL  Rosezena Sensor, ED    Imaging Review Dg Chest 2 View  03/23/2014   CLINICAL DATA:  Palpitations and tobacco use  EXAM: CHEST  2 VIEW  COMPARISON:  02/13/2013  FINDINGS: The heart size and mediastinal contours are within normal limits. Both lungs are clear. The visualized skeletal structures are unremarkable.  IMPRESSION: No active cardiopulmonary disease.   Electronically Signed   By: Alcide CleverMark  Lukens M.D.   On: 03/23/2014 16:04     EKG Interpretation   Date/Time:  Friday March 23 2014 14:43:44 EDT Ventricular Rate:  70 PR  Interval:  165 QRS Duration: 91 QT Interval:  383 QTC Calculation: 413 R Axis:   87 Text Interpretation:  Sinus rhythm Probable left atrial enlargement  Probable anteroseptal infarct, old axis has shifted from right to normal  Confirmed by Karma GanjaLINKER  MD, MARTHA [phone]([id number]) on 03/23/2014 4:34:21 PM      MDM   Final diagnoses:  Palpitations    Patient presents to ED for evaluation of palpitations. EKG, labs and imaging are unremarkable. Patient is otherwise healthy. He was given cardiology follow up. Return instructions given. Vital signs stable for discharge. Discussed case with Dr. Karma GanjaLinker who agree with plan. Patient / Family / Caregiver informed of clinical course, understand medical decision-making process, and agree with plan.   Mora BellmanHannah S Delsy Etzkorn, PA-C 03/24/14 1018

## 2014-03-23 NOTE — ED Notes (Signed)
Phlebotomy at the bedside  

## 2014-03-23 NOTE — Discharge Instructions (Signed)
Palpitations  A palpitation is the feeling that your heartbeat is irregular. It may feel like your heart is fluttering or skipping a beat. It may also feel like your heart is beating faster than normal. This is usually not a serious problem. In some cases, you may need more medical tests. HOME CARE  Avoid:  Caffeine in coffee, tea, soft drinks, diet pills, and energy drinks.  Chocolate.  Alcohol.  Stop smoking if you smoke.  Reduce your stress and anxiety. Try:  A method that measures bodily functions so you can learn to control them (biofeedback).  Yoga.  Meditation.  Physical activity such as swimming, jogging, or walking.  Get plenty of rest and sleep. GET HELP RIGHT AWAY IF:   You have chest pain.  You feel short of breath.  You have a very bad headache.  You feel dizzy or pass out (faint).  Your fast or irregular heartbeat continues after 24 hours.  Your palpitations occur more often. MAKE SURE YOU:   Understand these instructions.  Will watch your condition.  Will get help right away if you are not doing well or get worse. Document Released: 07/14/2008 Document Revised: 04/05/2012 Document Reviewed: 12/04/2011 Exit[employer] Patient Information 2014 [employer].  

## 2014-03-23 NOTE — ED Notes (Signed)
Marijane Trower, PA at the bedside.  

## 2014-03-23 NOTE — ED Notes (Signed)
Per EMS, Patient was stating that his heart was "beating out of the chest." Patient was not around any chemicals, not under any stress, and was not doing any strenuous activity. Patient was walking around work.  Initial BP was 140/100, but after patient calmed down BP 128/88, 80-90 HR, 96 CBG, 100 % RA. Patient denies, nausea, SOB, and pain.

## 2014-03-24 NOTE — ED Provider Notes (Signed)
Medical screening examination/treatment/procedure(s) were performed by non-physician practitioner and as supervising physician I was immediately available for consultation/collaboration.   EKG Interpretation   Date/Time:  Friday March 23 2014 14:43:44 EDT Ventricular Rate:  70 PR Interval:  165 QRS Duration: 91 QT Interval:  383 QTC Calculation: 413 R Axis:   87 Text Interpretation:  Sinus rhythm Probable left atrial enlargement  Probable anteroseptal infarct, old axis has shifted from right to normal  Confirmed by Karma Ganja  MD, MARTHA [phone]) on 03/23/2014 4:34:21 PM       Ethelda Chick, MD 03/24/14 1106

## 2016-10-21 ENCOUNTER — Encounter (HOSPITAL_COMMUNITY): Payer: Self-pay | Admitting: Emergency Medicine

## 2016-10-21 ENCOUNTER — Emergency Department (HOSPITAL_COMMUNITY): Payer: Worker's Compensation

## 2016-10-21 ENCOUNTER — Emergency Department (HOSPITAL_COMMUNITY)
Admission: EM | Admit: 2016-10-21 | Discharge: 2016-10-21 | Disposition: A | Discharge status: Home / Self Care | Payer: Worker's Compensation | Attending: Emergency Medicine | Admitting: Emergency Medicine

## 2016-10-21 DIAGNOSIS — W231XXA Caught, crushed, jammed, or pinched between stationary objects, initial encounter: Secondary | ICD-10-CM | POA: Insufficient documentation

## 2016-10-21 DIAGNOSIS — F17210 Nicotine dependence, cigarettes, uncomplicated: Secondary | ICD-10-CM | POA: Insufficient documentation

## 2016-10-21 DIAGNOSIS — Y929 Unspecified place or not applicable: Secondary | ICD-10-CM | POA: Insufficient documentation

## 2016-10-21 DIAGNOSIS — I10 Essential (primary) hypertension: Secondary | ICD-10-CM

## 2016-10-21 DIAGNOSIS — S6991XA Unspecified injury of right wrist, hand and finger(s), initial encounter: Secondary | ICD-10-CM | POA: Insufficient documentation

## 2016-10-21 DIAGNOSIS — Y9367 Activity, basketball: Secondary | ICD-10-CM | POA: Insufficient documentation

## 2016-10-21 DIAGNOSIS — Y999 Unspecified external cause status: Secondary | ICD-10-CM | POA: Insufficient documentation

## 2016-10-21 NOTE — ED Triage Notes (Signed)
Pt sts jammed his right pinky finger playing basketball yesterday

## 2016-10-21 NOTE — ED Notes (Signed)
Video visit coupon number and handout given to patient on discharge

## 2016-10-21 NOTE — ED Notes (Signed)
Dr Ray at bedside. 

## 2016-10-21 NOTE — ED Notes (Signed)
Ortho tech at bedside 

## 2016-10-21 NOTE — Discharge Instructions (Signed)
Please call hand doctor's office for follow up tomorrow.  Keep splint in place until follow up.

## 2016-10-21 NOTE — Progress Notes (Signed)
Orthopedic Tech Progress Note Patient Details:  Bryan Hansen [birth year] [id number]  Ortho Devices Type of Ortho Device: Finger splint Ortho Device/Splint Location: RUE pinky Ortho Device/Splint Interventions: Ordered, Application   Jennye MoccasinHughes, Mauro Arps Craig 10/21/2016, 5:55 PM

## 2016-10-21 NOTE — ED Provider Notes (Signed)
[facility] Provider Note   CSN: [id number] Arrival date & time: 10/21/16  1608  By signing my name below, I, Bryan Hansen, attest that this documentation has been prepared under the direction and in the presence of [name], MD.  Electronically Signed: [name], ED Scribe. 10/21/16. 5:31 PM.    History   Chief Complaint Chief Complaint  Patient presents with  . Finger Injury    The history is provided by the patient. No language interpreter was used.   HPI Comments: Bryan Hansen is a [age] y.o. male who has a PMhx of HTN presents to the Emergency Department complaining of sudden onset, gradual worsening, right fifth finger pain s/p an injury that occurred yesterday ~ 6:30 pm. Pt has associated swelling to the area. He notes he was playing basketball yesterday evening when he jammed his finger. Pt is not able to extend his finger fully secondary to pain. Pt further notes that he has no hx of similar injury to his right 5th finger.  He has not taken any medication to relieve his pain. He denies numbness or tingling. Pt has no known drug allergies. Pt is a smoker.   History reviewed. No pertinent past medical history.  Patient Active Problem List   Diagnosis Date Noted  . ESSENTIAL HYPERTENSION 05/28/2010  . CHRONIC OSTEOMYELITIS, HAND 05/28/2010  . OPEN FRACTURE MID/PROXIMAL PHALANX/PHALANG HAND 11/25/2009    History reviewed. No pertinent surgical history.     Home Medications    Prior to Admission medications   Not on File    Family History History reviewed. No pertinent family history.  Social History Social History  Substance Use Topics  . Smoking status: Current Every Day Smoker    Types: Cigarettes  . Smokeless tobacco: Not on file  . Alcohol use Yes     Comment: occasionally     Allergies   Patient has no known allergies.   Review of Systems Review of Systems  Musculoskeletal: Positive for arthralgias and joint swelling.  All other  systems reviewed and are negative.    Physical Exam Updated Vital Signs BP (!) 155/101 (BP Location: Left Arm)   Pulse 78   Temp 98.8 F (37.1 C) (Oral)   Resp 18   SpO2 95%   Physical Exam  Constitutional: He is oriented to person, place, and time. He appears well-developed and well-nourished.  HENT:  Head: Normocephalic and atraumatic.  Right Ear: External ear normal.  Left Ear: External ear normal.  Nose: Nose normal.  Mouth/Throat: Oropharynx is clear and moist.  Eyes: Conjunctivae and EOM are normal. Pupils are equal, round, and reactive to light.  Neck: Normal range of motion. Neck supple.  Cardiovascular: Normal rate.   Pulmonary/Chest: Effort normal and breath sounds normal. No respiratory distress. He has no wheezes. He exhibits no tenderness.  Abdominal: Soft. Bowel sounds are normal. He exhibits no distension and no mass. There is no tenderness. There is no guarding.  Musculoskeletal: Normal range of motion. He exhibits edema and tenderness.  Tenderness of the PIP joint. Unable to extend at PIP or DIP joint. Swelling to the PIP joint.   Neurological: He is alert and oriented to person, place, and time. He has normal reflexes. He exhibits normal muscle tone. Coordination normal.  Skin: Skin is warm and dry.  Psychiatric: He has a normal mood and affect. His behavior is normal. Judgment and thought content normal.  Nursing note and vitals reviewed.    ED Treatments / Results  DIAGNOSTIC  STUDIES: Oxygen Saturation is 95% on RA, adequate by my interpretation.  COORDINATION OF CARE:  5:30 PM Discussed treatment plan with pt at bedside and pt agreed to plan.  Labs (all labs ordered are listed, but only abnormal results are displayed) Labs Reviewed - No data to display  EKG  EKG Interpretation None       Radiology Dg Finger Little Right  Result Date: 10/21/2016 CLINICAL DATA:  Injury while playing basketball EXAM: RIGHT FIFTH FINGER 2+V COMPARISON:  May 28, 2010 FINDINGS: Frontal, oblique, and lateral views were obtained. There is evidence of old trauma involving the fifth proximal phalanx. There is overlying bandage. No acute fracture or dislocation is evident. The joint spaces appear unremarkable. No erosive change. IMPRESSION: Evidence of old trauma involving the distal aspect of the fifth proximal phalanx. No acute fracture or dislocation. No appreciable arthropathy. Electronically Signed   By: Bretta Bang III M.D.   On: 10/21/2016 17:03    Procedures Procedures (including critical care time)  Medications Ordered in ED Medications - No data to display   Initial Impression / Assessment and Plan / ED Course  I have reviewed the triage vital signs and the nursing notes.  Pertinent labs & imaging results that were available during my care of the patient were reviewed by me and considered in my medical decision making (see chart for details).  Clinical Course    Patient unable to extend at pip or dip joint of right 5th finger. Probable long extensor tendon disruption- patient to be splinted and to follow up with hand surgery.   Final Clinical Impressions(s) / ED Diagnoses   Final diagnoses:  Finger injury, right, initial encounter  Hypertension, unspecified type   I personally performed the services described in this documentation, which was scribed in my presence. The recorded information has been reviewed and considered.   New Prescriptions New Prescriptions   No medications on file     Margarita Grizzle, MD 10/21/16 2038

## 2017-04-29 ENCOUNTER — Encounter (HOSPITAL_COMMUNITY): Payer: Self-pay | Admitting: Emergency Medicine

## 2017-04-29 ENCOUNTER — Ambulatory Visit (HOSPITAL_COMMUNITY)
Admission: EM | Admit: 2017-04-29 | Discharge: 2017-04-29 | Disposition: A | Discharge status: Home / Self Care | Payer: Self-pay | Attending: Emergency Medicine | Admitting: Emergency Medicine

## 2017-04-29 DIAGNOSIS — S76211A Strain of adductor muscle, fascia and tendon of right thigh, initial encounter: Secondary | ICD-10-CM

## 2017-04-29 DIAGNOSIS — R1030 Lower abdominal pain, unspecified: Secondary | ICD-10-CM

## 2017-04-29 MED ORDER — KETOROLAC TROMETHAMINE 60 MG/2ML IM SOLN
INTRAMUSCULAR | Status: AC
  Filled 2017-04-29: qty 2

## 2017-04-29 MED ORDER — NAPROXEN 500 MG PO TABS
500.0000 mg | ORAL_TABLET | Freq: Two times a day (BID) | ORAL | 0 refills | Status: DC
Start: 2017-04-29 — End: 2018-01-31

## 2017-04-29 MED ORDER — CYCLOBENZAPRINE HCL 10 MG PO TABS: 10.0000 mg | ORAL_TABLET | Freq: Two times a day (BID) | ORAL | 0 refills | Status: DC | PRN

## 2017-04-29 MED ORDER — KETOROLAC TROMETHAMINE 60 MG/2ML IM SOLN
60.0000 mg | Freq: Once | INTRAMUSCULAR | Status: AC
  Administered 2017-04-29: 60 mg via INTRAMUSCULAR

## 2017-04-29 NOTE — ED Triage Notes (Addendum)
Pt reports doing yard work and washing his dog on Tuesday.  Wednesday morning he woke up with left groin pain.  He describes the pain as a constant dull pain at rest and a cramping shooting pain with movement.  Pt took antiinflammatories with no relief, but [facility] pain last night.

## 2017-04-29 NOTE — ED Provider Notes (Signed)
CSN: [id number]     Arrival date & time 04/29/17  [id number] History   None    Chief Complaint  Patient presents with  . Groin Pain    left   (Consider location/radiation/quality/duration/timing/severity/associated sxs/prior Treatment) Patient c/o left groin pain starting starting today.   The history is provided by the patient.  Groin Pain  This is a new problem. The problem occurs constantly. The problem has not changed since onset.Nothing aggravates the symptoms. Nothing relieves the symptoms.    History reviewed. No pertinent past medical history. History reviewed. No pertinent surgical history. History reviewed. No pertinent family history. Social History  Substance Use Topics  . Smoking status: Current Every Day Smoker    Packs/day: 0.25    Types: Cigarettes  . Smokeless tobacco: Never Used  . Alcohol use Yes     Comment: occasionally    Review of Systems  Constitutional: Negative.   HENT: Negative.   Eyes: Negative.   Respiratory: Negative.   Cardiovascular: Negative.   Gastrointestinal: Negative.   Endocrine: Negative.   Genitourinary: Negative.   Musculoskeletal: Positive for arthralgias.  Allergic/Immunologic: Negative.   Neurological: Negative.   Hematological: Negative.     Allergies  Patient has no known allergies.  Home Medications   Prior to Admission medications   Medication Sig Start Date End Date Taking? Authorizing Provider  cyclobenzaprine (FLEXERIL) 10 MG tablet Take 1 tablet (10 mg total) by mouth 2 (two) times daily as needed for muscle spasms. 04/29/17   Deatra Canterxford, Tarini Carrier J, FNP  naproxen (NAPROSYN) 500 MG tablet Take 1 tablet (500 mg total) by mouth 2 (two) times daily with a meal. 04/29/17   Kairy Folsom, Anselm PancoastWilliam J, FNP   Meds Ordered and Administered this Visit   Medications  ketorolac (TORADOL) injection 60 mg (60 mg Intramuscular Given 04/29/17 1039)    BP (!) 152/82 (BP Location: Right Arm)   Pulse 78   Temp 98.8 F (37.1 C) (Oral)   SpO2  98%  No data found.   Physical Exam  Constitutional: He appears well-developed and well-nourished.  HENT:  Head: Normocephalic and atraumatic.  Eyes: Pupils are equal, round, and reactive to light. Conjunctivae and EOM are normal.  Neck: Normal range of motion. Neck supple.  Cardiovascular: Normal rate, regular rhythm and normal heart sounds.   Pulmonary/Chest: Effort normal and breath sounds normal.  Musculoskeletal: He exhibits tenderness.  TTP left groin  Nursing note and vitals reviewed.   Urgent Care Course     Procedures (including critical care time)  Labs Review Labs Reviewed - No data to display  Imaging Review No results found.   Visual Acuity Review  Right Eye Distance:   Left Eye Distance:   Bilateral Distance:    Right Eye Near:   Left Eye Near:    Bilateral Near:         MDM   1. Groin strain, right, initial encounter    Naprosyn 500 mg one po bid x 10 days Flexeril 10mg  one po bid prn #20      Deatra CanterOxford, Arsal Tappan J, FNP 04/29/17 1902

## 2018-01-31 ENCOUNTER — Encounter (HOSPITAL_COMMUNITY): Payer: Self-pay | Admitting: Emergency Medicine

## 2018-01-31 ENCOUNTER — Ambulatory Visit (HOSPITAL_COMMUNITY)
Admission: EM | Admit: 2018-01-31 | Discharge: 2018-01-31 | Disposition: A | Discharge status: Home / Self Care | Payer: Self-pay | Attending: Urgent Care | Admitting: Urgent Care

## 2018-01-31 DIAGNOSIS — S39011A Strain of muscle, fascia and tendon of abdomen, initial encounter: Secondary | ICD-10-CM

## 2018-01-31 DIAGNOSIS — R1032 Left lower quadrant pain: Secondary | ICD-10-CM

## 2018-01-31 MED ORDER — CELECOXIB 100 MG PO CAPS: 100.0000 mg | ORAL_CAPSULE | Freq: Two times a day (BID) | ORAL | 0 refills | Status: DC

## 2018-01-31 MED ORDER — CYCLOBENZAPRINE HCL 5 MG PO TABS: 5.0000 mg | ORAL_TABLET | Freq: Every day | ORAL | 0 refills | Status: DC

## 2018-01-31 NOTE — ED Provider Notes (Signed)
  MRN: [id number] DOB: [birth year]  Subjective:   Bryan Hansen is a 42 y.o. male presenting for 2-day history of left lower belly pain.  Patient reports the pain has been pretty constant, is aching in nature.  Pain started after he was doing some yard work and horse playing around with his dog.  Denies fever, nausea, vomiting, bloody stools, dysuria, hematuria, constipation, diarrhea.  Patient has very regular bowel movements, last bowel movement was today.  He hydrates very well daily.  Has not tried any medications for relief.  Denies family history of Crohn disease, ulcerative colitis, colon cancer.  Patient smokes about 1/2 pack/day.  He does not take any chronic medications for chronic medical conditions and has No Known Allergies.  Denies past medical and surgical history.  Objective:   Vitals: There were no vitals taken for this visit.  Physical Exam  Constitutional: He is oriented to person, place, and time. He appears well-developed and well-nourished.  HENT:  Mouth/Throat: Oropharynx is clear and moist.  Eyes: Right eye exhibits no discharge. Left eye exhibits no discharge.  Cardiovascular: Normal rate, regular rhythm and intact distal pulses. Exam reveals no gallop and no friction rub.  No murmur heard. Pulmonary/Chest: No respiratory distress. He has no wheezes. He has no rales.  Abdominal: Soft. Bowel sounds are normal. He exhibits no distension and no mass. There is tenderness (over area depicted). There is no rebound and no guarding.    Neurological: He is alert and oriented to person, place, and time.  Skin: Skin is warm and dry.  Psychiatric: He has a normal mood and affect.   Assessment and Plan :   Strain of abdominal wall, initial encounter  LLQ abdominal pain  We will manage as a abdominal wall strain with Celebrex and Flexeril, rest.  Patient is to hydrate very well. Counseled patient on potential for adverse effects with medications prescribed today, patient  verbalized understanding. Return-to-clinic precautions discussed, patient verbalized understanding.    Wallis BambergMani, Claudie Brickhouse, [place]PA-C 01/31/18 [id number]

## 2018-01-31 NOTE — Discharge Instructions (Signed)
Hydrate well with at least 2 liters (1 gallon) of water daily.  °

## 2018-01-31 NOTE — ED Triage Notes (Signed)
Pt sts LLQ into left side pain x 2 days

## 2018-07-01 ENCOUNTER — Encounter (HOSPITAL_COMMUNITY): Payer: Self-pay

## 2018-07-01 ENCOUNTER — Ambulatory Visit (HOSPITAL_COMMUNITY)
Admission: EM | Admit: 2018-07-01 | Discharge: 2018-07-01 | Disposition: A | Discharge status: Home / Self Care | Payer: Self-pay | Attending: Nurse Practitioner | Admitting: Nurse Practitioner

## 2018-07-01 DIAGNOSIS — S80211A Abrasion, right knee, initial encounter: Secondary | ICD-10-CM

## 2018-07-01 DIAGNOSIS — S76302A Unspecified injury of muscle, fascia and tendon of the posterior muscle group at thigh level, left thigh, initial encounter: Secondary | ICD-10-CM

## 2018-07-01 DIAGNOSIS — S60511A Abrasion of right hand, initial encounter: Secondary | ICD-10-CM

## 2018-07-01 MED ORDER — CYCLOBENZAPRINE HCL 10 MG PO TABS: 10.0000 mg | ORAL_TABLET | Freq: Two times a day (BID) | ORAL | 0 refills | Status: DC | PRN

## 2018-07-01 MED ORDER — DICLOFENAC SODIUM 50 MG PO TBEC: 50.0000 mg | DELAYED_RELEASE_TABLET | Freq: Three times a day (TID) | ORAL | 0 refills | Status: DC | PRN
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
## 2018-09-13 IMAGING — DX DG FINGER LITTLE 2+V*R*
3 series · 3 of 3 positions shown · non-contrast
Comparison: May 28, 2010

CLINICAL DATA: Injury while playing basketball

EXAM:
RIGHT FIFTH FINGER 2+V

[finger ap]
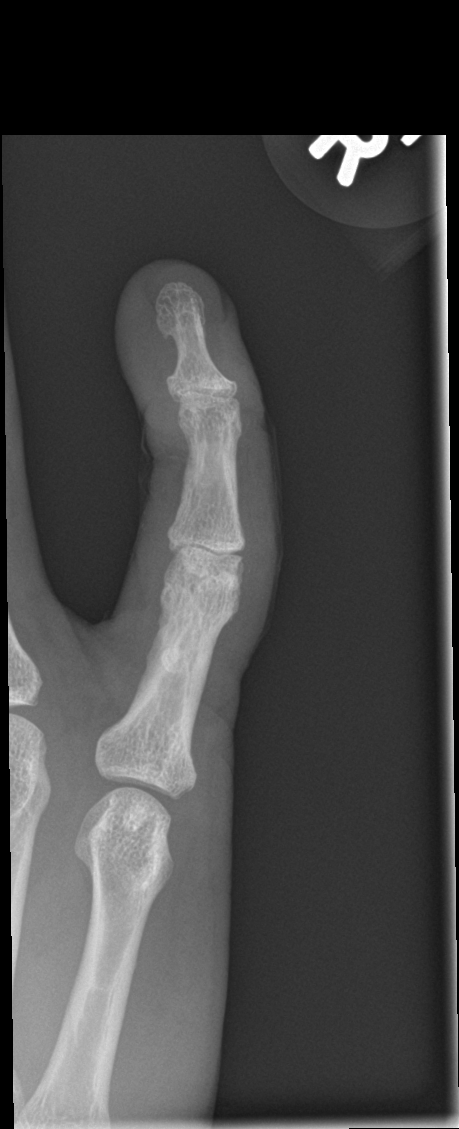

[finger obl]
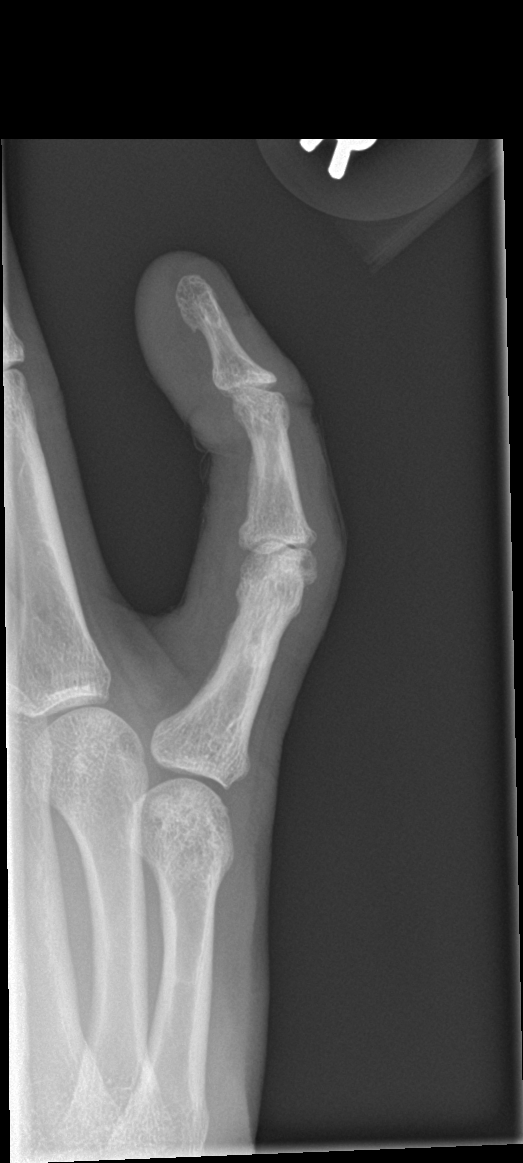

[finger lat]
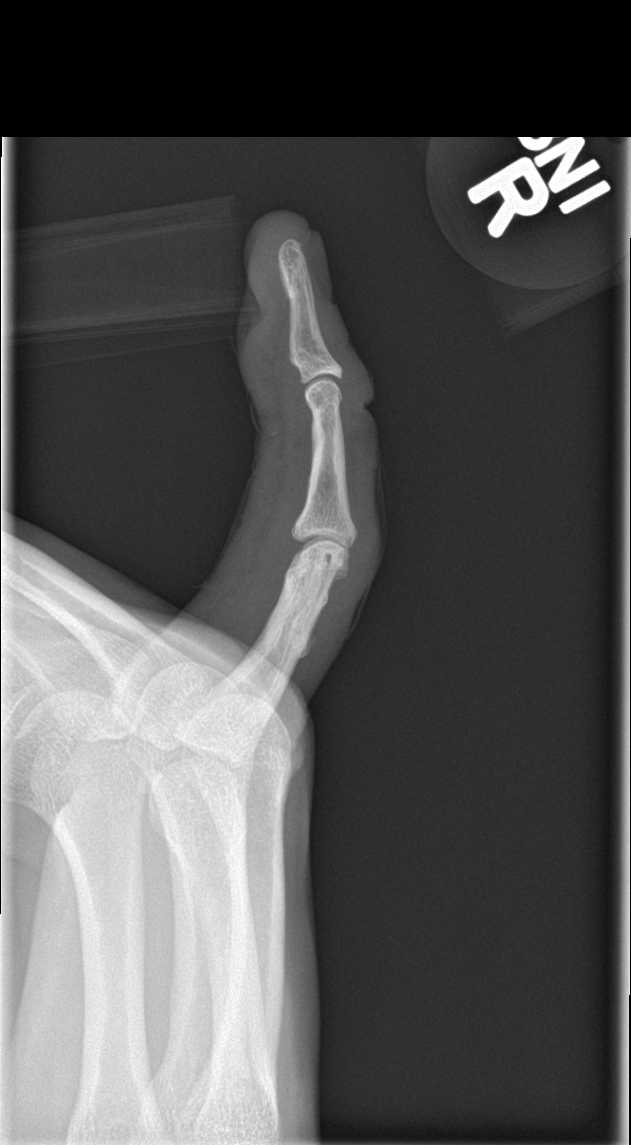

[3 of 3 positions shown; findings below may reference images not displayed]

FINDINGS: Frontal, oblique, and lateral views were obtained. There is evidence
of old trauma involving the fifth proximal phalanx. There is
overlying bandage. No acute fracture or dislocation is evident. The
joint spaces appear unremarkable. No erosive change.
IMPRESSION: Evidence of old trauma involving the distal aspect of the fifth
proximal phalanx. No acute fracture or dislocation. No appreciable
arthropathy.

## 2022-02-05 ENCOUNTER — Ambulatory Visit (INDEPENDENT_AMBULATORY_CARE_PROVIDER_SITE_OTHER): Payer: Self-pay

## 2022-02-05 ENCOUNTER — Encounter (HOSPITAL_COMMUNITY): Payer: Self-pay | Admitting: Emergency Medicine

## 2022-02-05 ENCOUNTER — Ambulatory Visit (HOSPITAL_COMMUNITY)
Admission: EM | Admit: 2022-02-05 | Discharge: 2022-02-05 | Disposition: A | Discharge status: Home / Self Care | Payer: Self-pay | Attending: Family Medicine | Admitting: Family Medicine

## 2022-02-05 DIAGNOSIS — M79675 Pain in left toe(s): Secondary | ICD-10-CM

## 2022-02-05 DIAGNOSIS — M79674 Pain in right toe(s): Secondary | ICD-10-CM | POA: Insufficient documentation

## 2022-02-05 LAB — BASIC METABOLIC PANEL
Anion gap: 9 (ref 5–15)
BUN: 10 mg/dL (ref 6–20)
CO2: 23 mmol/L (ref 22–32)
Calcium: 9.7 mg/dL (ref 8.9–10.3)
Chloride: 105 mmol/L (ref 98–111)
Creatinine, Ser: 0.99 mg/dL (ref 0.61–1.24)
GFR, Estimated: >60 mL/min (ref >60)
Glucose, Bld: 90 mg/dL (ref 70–99)
Potassium: 4.6 mmol/L (ref 3.5–5.1)
Sodium: 137 mmol/L (ref 135–145)

## 2022-02-05 LAB — URIC ACID: Uric Acid, Serum: 6.6 mg/dL (ref 3.7–8.6)

## 2022-02-05 LAB — CBC
HCT: 46.3 % (ref 39.0–52.0)
Hemoglobin: 16.2 g/dL (ref 13.0–17.0)
MCH: 32.7 pg (ref 26.0–34.0)
MCHC: 35 g/dL (ref 30.0–36.0)
MCV: 93.5 fL (ref 80.0–100.0)
Platelets: 291 10*3/uL (ref 150–400)
RBC: 4.95 MIL/uL (ref 4.22–5.81)
RDW: 11.7 % (ref 11.5–15.5)
WBC: 6.1 10*3/uL (ref 4.0–10.5)
nRBC: 0 % (ref 0.0–0.2)

## 2022-02-05 MED ORDER — PREDNISONE 20 MG PO TABS: 40.0000 mg | ORAL_TABLET | Freq: Every day | ORAL | 0 refills | Status: AC

## 2022-02-05 NOTE — ED Triage Notes (Signed)
Pt is present today with right big toe pain. Pt states that he is not able bare weight on his foot without feeling a sharp pain. Denies any injury  . Pt sx started Tuesday  ?

## 2022-02-05 NOTE — ED Provider Notes (Addendum)
?[facility] ? ? ? ?CSN: [id number] ?Arrival date & time: 02/05/22  0801 ? ? ?  ? ?History   ?Chief Complaint ?Chief Complaint  ?Patient presents with  ? Toe Pain  ? ? ?HPI ?Bryan Hansen is a 46 y.o. male.  ? ? ?Toe Pain ? ?Here for pain and stiffness in his left first MTP joint. Is swollen. Tender when bearing wt. Not necessarily hurting for sheets to touch it. ? ? ? ? ?History reviewed. No pertinent past medical history. ? ?Patient Active Problem List  ? Diagnosis Date Noted  ? ESSENTIAL HYPERTENSION 05/28/2010  ? 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 OPEN FRACTURE MID/PROXIMAL PHALANX/PHALANG HAND 11/25/2009  ? ? ?History reviewed. No pertinent surgical history. ? ? ? ? ?Home Medications   ? ?Prior to Admission medications   ?Medication Sig Start Date End Date Taking? Authorizing Provider  ?predniSONE (DELTASONE) 20 MG tablet Take 2 tablets (40 mg total) by mouth daily with breakfast for 5 days. 02/05/22 02/10/22 Yes Zenia Resides, MD  ? ? ?Family History ?Family History  ?Problem Relation Age of Onset  ? Healthy Mother   ? Healthy Father   ? ? ?Social History ?Social History  ? ?Tobacco Use  ? Smoking status: Every Day  ?  Packs/day: 0.25  ?  Types: Cigarettes  ? Smokeless tobacco: Never  ?Substance Use Topics  ? Alcohol use: Yes  ?  Comment: occasionally  ? Drug use: No  ? ? ? ?Allergies   ?Patient has no known allergies. ? ? ?Review of Systems ?Review of Systems ? ? ?Physical Exam ?Triage Vital Signs ?ED Triage Vitals  ?Enc Vitals Group  ?   BP 02/05/22 0815 (!) 181/110  ?   Pulse Rate 02/05/22 0815 80  ?   Resp 02/05/22 0815 17  ?   Temp 02/05/22 0815 98.3 ?F (36.8 ?C)  ?   Temp src --   ?   SpO2 02/05/22 0815 98 %  ?   Weight --   ?   Height --   ?   Head Circumference --   ?   Peak Flow --   ?   Pain Score 02/05/22 0813 7  ?   Pain Loc --   ?   Pain Edu? --   ?   Excl. in GC? --   ? ?No data found. ? ?Updated Vital Signs ?BP (!) 181/110   Pulse 80   Temp 98.3 ?F (36.8 ?C)   Resp 17    SpO2 98%  ? ?Visual Acuity ?Right Eye Distance:   ?Left Eye Distance:   ?Bilateral Distance:   ? ?Right Eye Near:   ?Left Eye Near:    ?Bilateral Near:    ? ?Physical Exam ? ? ?UC Treatments / Results  ?Labs ?(all labs ordered are listed, but only abnormal results are displayed) ?Labs Reviewed  ?CBC  ?BASIC METABOLIC PANEL  ?URIC ACID  ? ? ?EKG ? ? ?Radiology ?No results found. ? ?Procedures ?Procedures (including critical care time) ? ?Medications Ordered in UC ?Medications - No data to display ? ?Initial Impression / Assessment and Plan / UC Course  ?I have reviewed the triage vital signs and the nursing notes. ? ?Pertinent labs & imaging results that were available during my care of the patient were reviewed by me and considered in my medical decision making (see chart for details). ? ?  ? ?Xray shows only some mild DJD. Will tx with prednisone for poss gout.  Request made to help him find a pcp. ?Final Clinical Impressions(s) / UC Diagnoses  ? ?Final diagnoses:  ?Great toe pain, left  ? ? ? ?Discharge Instructions   ? ?  ?Your xray showed some arthritis.  ? ?Blood had been drawn to check your uric acid, to see if gout might be causing this worse pain/swelling. ? ?Take prednisone 20 mg --2 tabs daily for 5 days. ? ? ? ? ? ? ?ED Prescriptions   ? ? Medication Sig Dispense Auth. Provider  ? predniSONE (DELTASONE) 20 MG tablet Take 2 tablets (40 mg total) by mouth daily with breakfast for 5 days. 10 tablet Zenia Resides, MD  ? ?  ? ?I have reviewed the PDMP during this encounter. ?  ?Zenia Resides, MD ?02/05/22 [phone] ? ?  ?Zenia Resides, MD ?02/10/22 [phone] ? ?

## 2022-02-05 NOTE — Discharge Instructions (Addendum)
Your xray showed some arthritis.  ? ?Blood had been drawn to check your uric acid, to see if gout might be causing this worse pain/swelling. ? ?Take prednisone 20 mg --2 tabs daily for 5 days. ? ? ?

## 2023-07-09 ENCOUNTER — Emergency Department (HOSPITAL_BASED_OUTPATIENT_CLINIC_OR_DEPARTMENT_OTHER): Admission: EM | Admit: 2023-07-09 | Discharge: 2023-07-09 | Discharge status: Absent without leave | Payer: Self-pay

## 2023-11-17 ENCOUNTER — Ambulatory Visit (HOSPITAL_COMMUNITY)
Admission: EM | Admit: 2023-11-17 | Discharge: 2023-11-17 | Disposition: A | Discharge status: Home / Self Care | Payer: Self-pay | Attending: Family Medicine | Admitting: Family Medicine

## 2023-11-17 ENCOUNTER — Encounter (HOSPITAL_COMMUNITY): Payer: Self-pay

## 2023-11-17 DIAGNOSIS — R110 Nausea: Secondary | ICD-10-CM

## 2023-11-17 DIAGNOSIS — J069 Acute upper respiratory infection, unspecified: Secondary | ICD-10-CM

## 2023-11-17 LAB — POC COVID19/FLU A&B COMBO
Covid Antigen, POC: NEGATIVE
Influenza A Antigen, POC: NEGATIVE
Influenza B Antigen, POC: NEGATIVE

## 2023-11-17 MED ORDER — PROMETHAZINE-DM 6.25-15 MG/5ML PO SYRP: 5.0000 mL | ORAL_SOLUTION | Freq: Four times a day (QID) | ORAL | 0 refills | Status: DC | PRN

## 2023-11-17 MED ORDER — ONDANSETRON 4 MG PO TBDP: 4.0000 mg | ORAL_TABLET | Freq: Three times a day (TID) | ORAL | 0 refills | Status: DC | PRN

## 2023-11-17 NOTE — Discharge Instructions (Signed)
Results for orders placed or performed during the hospital encounter of 11/17/23  POC Covid19/Flu A&B Antigen   Collection Time: 11/17/23  2:49 PM  Result Value Ref Range   Influenza A Antigen, POC Negative Negative   Influenza B Antigen, POC Negative Negative   Covid Antigen, POC Negative Negative

## 2023-11-17 NOTE — ED Provider Notes (Signed)
[facility]   [id number] 11/17/23 Arrival Time: 1333  ASSESSMENT & PLAN:  1. Viral URI with cough   2. Nausea    Discussed typical duration of likely viral illness. Results for orders placed or performed during the hospital encounter of 11/17/23  POC Covid19/Flu A&B Antigen   Collection Time: 11/17/23  2:49 PM  Result Value Ref Range   Influenza A Antigen, POC Negative Negative   Influenza B Antigen, POC Negative Negative   Covid Antigen, POC Negative Negative    OTC symptom care as needed.  New Prescriptions   ONDANSETRON (ZOFRAN-ODT) 4 MG DISINTEGRATING TABLET    Take 1 tablet (4 mg total) by mouth every 8 (eight) hours as needed for nausea or vomiting.   PROMETHAZINE-DEXTROMETHORPHAN (PROMETHAZINE-DM) 6.25-15 MG/5ML SYRUP    Take 5 mLs by mouth 4 (four) times daily as needed for cough.     Follow-up Information     [facility]   Specialty: Urgent Care Why: If worsening or failing to improve as anticipated. Contact information: [address] [place] [address] [phone]                Reviewed expectations re: course of current medical issues. Questions answered. Outlined signs and symptoms indicating need for more acute intervention. Understanding verbalized. After Visit Summary given.   SUBJECTIVE: History from: Patient. Bryan Hansen is a 48 y.o. male. Reports: cough, nasal congestion, chest congestion, fatigue. Mild nausea; no emesis. Wife with same but she is now improving. Denies: difficulty breathing. Normal PO intake.  OBJECTIVE:  Vitals:   11/17/23 1423  BP: (!) 178/89  Pulse: 83  Resp: 18  Temp: 98.2 F (36.8 C)  TempSrc: Oral  SpO2: 97%    General appearance: alert; no distress Eyes: PERRLA; EOMI; conjunctiva normal HENT: [place]; AT; with nasal congestion Neck: supple  Lungs: speaks full sentences without difficulty; unlabored; CTAB Extremities: no edema Skin: warm and  dry Neurologic: normal gait Psychological: alert and cooperative; normal mood and affect  Labs: Results for orders placed or performed during the hospital encounter of 11/17/23  POC Covid19/Flu A&B Antigen   Collection Time: 11/17/23  2:49 PM  Result Value Ref Range   Influenza A Antigen, POC Negative Negative   Influenza B Antigen, POC Negative Negative   Covid Antigen, POC Negative Negative   Labs Reviewed  POC COVID19/FLU A&B COMBO    Imaging: No results found.  No Known Allergies  History reviewed. No pertinent past medical history. Social History   Socioeconomic History   Marital status: Single    Spouse name: Not on file   Number of children: Not on file   Years of education: Not on file   Highest education level: Not on file  Occupational History   Not on file  Tobacco Use   Smoking status: Some Days    Current packs/day: 0.25    Types: Cigarettes   Smokeless tobacco: Never  Substance and Sexual Activity   Alcohol use: Yes    Comment: occasionally   Drug use: No   Sexual activity: Not on file  Other Topics Concern   Not on file  Social History Narrative   Not on file   Social Drivers of Health   Financial Resource Strain: Not on file  Food Insecurity: Not on file  Transportation Needs: Not on file  Physical Activity: Not on file  Stress: Not on file  Social Connections: Not on file  Intimate Partner Violence:  Not on file   Family History  Problem Relation Age of Onset   Healthy Mother    Healthy Father    History reviewed. No pertinent surgical history.   Mardella Layman, MD 11/17/23 [phone]

## 2023-11-17 NOTE — ED Triage Notes (Addendum)
Pt c/o fever, vomiting 1x, loss of appetite, nausea, fatigue, and a cough. Pt also c/o of right elbow pain has been going going on for two weeks.   Start date: 11/15/2023  Home Interventions: Liquid Cold Medication

## 2023-12-29 IMAGING — DX DG FOOT 2V*L*
2 series · 2 of 2 positions shown · non-contrast
Comparison: None.

CLINICAL DATA: First metatarsophalangeal joint pain.

EXAM:
LEFT FOOT - 2 VIEW

[foot ap]
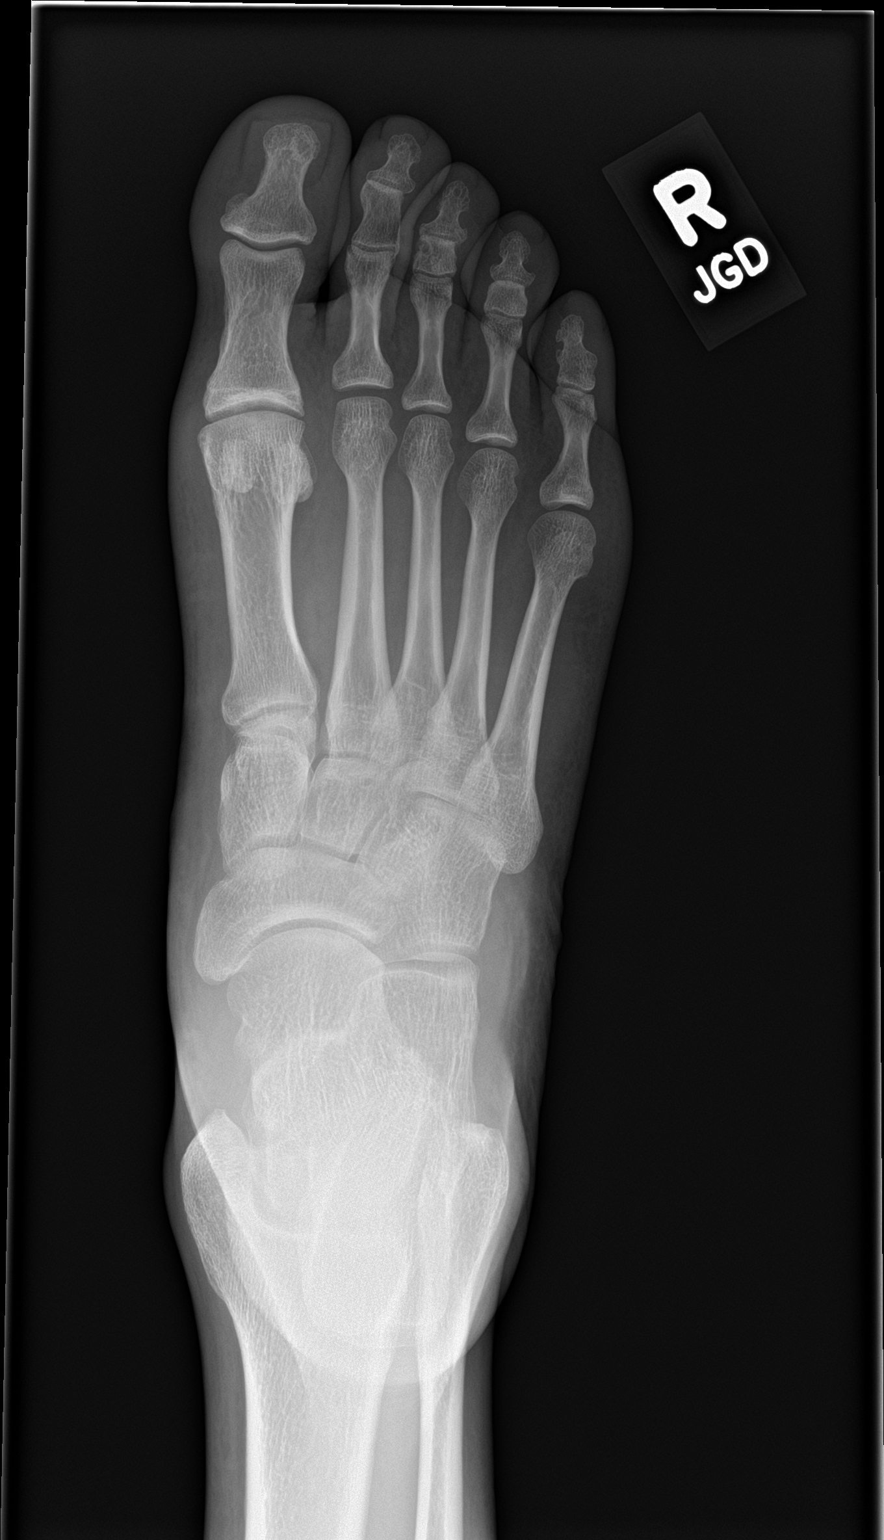

[foot lat]
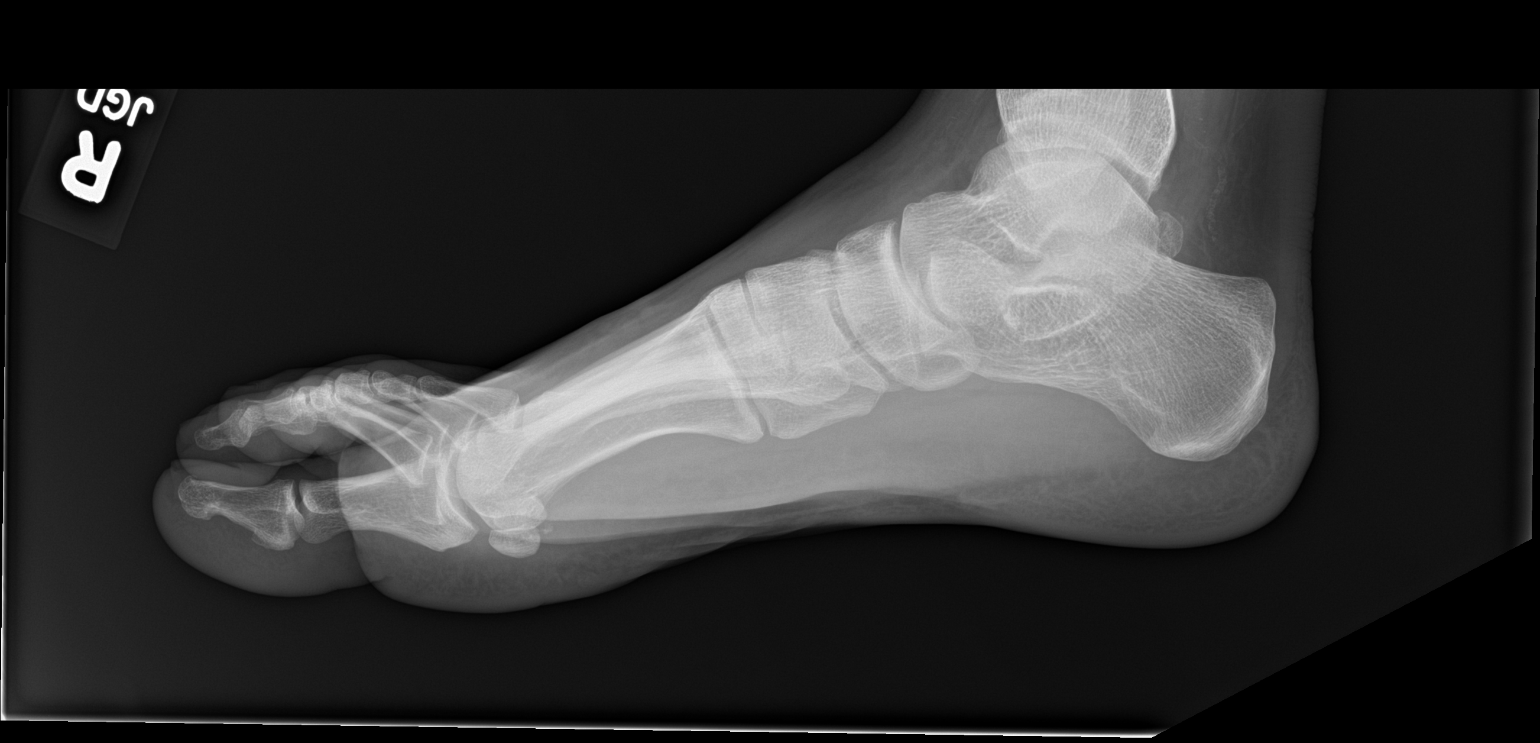

[2 of 2 positions shown; findings below may reference images not displayed]

FINDINGS: Normal bone mineralization. Minimal lateral great toe
metatarsophalangeal joint space narrowing and peripheral spurring.
No acute fracture is seen. No dislocation.
IMPRESSION: Minimal great toe metatarsophalangeal joint osteoarthritis.

## 2024-06-12 ENCOUNTER — Ambulatory Visit (HOSPITAL_COMMUNITY)
Admission: EM | Admit: 2024-06-12 | Discharge: 2024-06-12 | Disposition: A | Discharge status: Home / Self Care | Payer: Self-pay | Attending: Family Medicine | Admitting: Family Medicine

## 2024-06-12 ENCOUNTER — Encounter (HOSPITAL_COMMUNITY): Payer: Self-pay

## 2024-06-12 DIAGNOSIS — S0091XA Abrasion of unspecified part of head, initial encounter: Secondary | ICD-10-CM

## 2024-06-12 DIAGNOSIS — I10 Essential (primary) hypertension: Secondary | ICD-10-CM

## 2024-06-12 NOTE — ED Triage Notes (Signed)
 Pt states Saturday evening he was walking his dog and it went after another dog and drug him on concrete. Abrasions to face and swelling to nose. States unknown LOC. States put neosporin to abrasions. States needs a work note.

## 2024-06-12 NOTE — ED Provider Notes (Signed)
 [facility]    CSN: [id number] Arrival date & time: 06/12/24  1250      History   Chief Complaint Chief Complaint  Patient presents with   Fall    HPI Bryan Hansen is a 48 y.o. male.    Fall Pertinent negatives include no headaches.  Patient is here after a fall.  He was walking his dog about 2 nights ago, bolted after another dog, and dragged him on concrete.  Has abrasions to the nose, and forehead.  No other issues.  He is using neosporin.  He is requesting a note for work if able.   Of note, blood pressure is quite elevated today . It has been elevated on multiple visits.  He does not have a PCP.       History reviewed. No pertinent past medical history.  Patient Active Problem List   Diagnosis Date Noted   Essential hypertension 05/28/2010   CHRONIC OSTEOMYELITIS, HAND 05/28/2010   OPEN FRACTURE MID/PROXIMAL PHALANX/PHALANG HAND 11/25/2009    History reviewed. No pertinent surgical history.     Home Medications    Prior to Admission medications   Not on File    Family History Family History  Problem Relation Age of Onset   Healthy Mother    Healthy Father     Social History Social History   Tobacco Use   Smoking status: Some Days    Current packs/day: 0.25    Types: Cigarettes   Smokeless tobacco: Never  Substance Use Topics   Alcohol use: Yes    Comment: occasionally   Drug use: No     Allergies   Patient has no known allergies.   Review of Systems Review of Systems  Constitutional: Negative.   HENT: Negative.    Respiratory: Negative.    Cardiovascular: Negative.   Gastrointestinal: Negative.   Musculoskeletal: Negative.   Skin:  Positive for wound.  Neurological:  Negative for dizziness and headaches.     Physical Exam Triage Vital Signs ED Triage Vitals [06/12/24 1417]  Encounter Vitals Group     BP (!) 188/103     Girls Systolic BP Percentile      Girls Diastolic BP Percentile      Boys Systolic  BP Percentile      Boys Diastolic BP Percentile      Pulse Rate (!) 101     Resp 18     Temp 98.2 F (36.8 C)     Temp Source Oral     SpO2 98 %     Weight      Height      Head Circumference      Peak Flow      Pain Score 5     Pain Loc      Pain Education      Exclude from Growth Chart    No data found.  Updated Vital Signs BP (!) 188/103 (BP Location: Left Arm)   Pulse (!) 101   Temp 98.2 F (36.8 C) (Oral)   Resp 18   SpO2 98%   Visual Acuity Right Eye Distance:   Left Eye Distance:   Bilateral Distance:    Right Eye Near:   Left Eye Near:    Bilateral Near:     Physical Exam Constitutional:      Appearance: Normal appearance. He is normal weight.  Skin:    Comments: One small and one medium abrasion to the forehead/scalp on the right;  another abrasion  to the bridge of the nose;  no active bleeding noted;   Neurological:     General: No focal deficit present.     Mental Status: He is alert.  Psychiatric:        Mood and Affect: Mood normal.      UC Treatments / Results  Labs (all labs ordered are listed, but only abnormal results are displayed) Labs Reviewed - No data to display  EKG   Radiology No results found.  Procedures Procedures (including critical care time)  Medications Ordered in UC Medications - No data to display  Initial Impression / Assessment and Plan / UC Course  I have reviewed the triage vital signs and the nursing notes.  Pertinent labs & imaging results that were available during my care of the patient were reviewed by me and considered in my medical decision making (see chart for details).   Final Clinical Impressions(s) / UC Diagnoses   Final diagnoses:  None   Discharge Instructions   None    ED Prescriptions   None    PDMP not reviewed this encounter.   Darral Longs, MD 06/12/24 [phone]

## 2024-06-12 NOTE — Discharge Instructions (Signed)
 You were seen today for abrasions to the face after a fall.  I recommend you use over the counter bacitracin ointment instead of the neosporin.  They will heal on their own.   Your blood pressure is very elevated today.  I have placed a referral for PCP assistance, but you may go to [email or web address] to find a provider that works for you.

## 2024-07-07 ENCOUNTER — Encounter: Payer: Self-pay | Admitting: Family

## 2024-07-07 NOTE — Progress Notes (Signed)
   This encounter was created in error - please disregard. No show

## 2024-07-07 NOTE — Progress Notes (Signed)
 This encounter was created in error - please disregard.

## 2024-07-19 ENCOUNTER — Telehealth (HOSPITAL_COMMUNITY): Payer: Self-pay

## 2024-07-19 NOTE — Telephone Encounter (Signed)
 Received notification that patient may need assistance setting up PCP.  Attempted to reach patient x1. Wrong number on file.
